# Patient Record
Sex: Male | Born: 1950 | Race: White | Hispanic: No | Smoking: Former smoker
Health system: Southern US, Community
[De-identification: ages and names within clinical notes are randomized; demographics above are authoritative.]

## PROBLEM LIST (undated history)

## (undated) DIAGNOSIS — R51 Headache: Secondary | ICD-10-CM

## (undated) DIAGNOSIS — Z8739 Personal history of other diseases of the musculoskeletal system and connective tissue: Secondary | ICD-10-CM

## (undated) DIAGNOSIS — M1990 Unspecified osteoarthritis, unspecified site: Secondary | ICD-10-CM

## (undated) DIAGNOSIS — M545 Low back pain, unspecified: Secondary | ICD-10-CM

## (undated) DIAGNOSIS — M2550 Pain in unspecified joint: Secondary | ICD-10-CM

## (undated) HISTORY — PX: HERNIA REPAIR: SHX51

## (undated) HISTORY — PX: OTHER SURGICAL HISTORY: SHX169

---

## 1978-09-21 HISTORY — PX: NASAL SEPTUM SURGERY: SHX37

## 1988-09-20 HISTORY — PX: UMBILICAL HERNIA REPAIR: SHX196

## 2011-07-16 HISTORY — PX: SHOULDER ARTHROSCOPY W/ ROTATOR CUFF REPAIR: SHX2400

## 2012-04-19 ENCOUNTER — Encounter (HOSPITAL_COMMUNITY): Payer: Self-pay

## 2012-04-21 NOTE — Pre-Procedure Instructions (Signed)
Travis Collier  04/21/2012   Your procedure is scheduled on:  Wednesday, April 9  Report to [facility] Short Stay Center at 0745 AM.  Call this number if you have problems the morning of surgery: [phone]   Remember:   Do not eat food or drink liquids after midnight.Tuesday night   Take these medicines the morning of surgery with A SIP OF WATER: Amlodipine   Do not wear jewelry, make-up or nail polish.  Do not wear lotions, powders, or perfumes or  deodorant.  Do not shave 48 hours prior to surgery. Men may shave face and neck.  Do not bring valuables to the hospital.  Contacts, dentures or bridgework may not be worn into surgery.  Leave suitcase in the car. After surgery it may be brought to your room.  For patients admitted to the hospital, checkout time is 11:00 AM the day of  discharge.      Special Instructions: Shower using CHG 2 nights before surgery and the night before surgery.  If you shower the day of surgery use CHG.  Use special wash - you have one bottle of CHG for all showers.  You should use approximately 1/3 of the bottle for each shower.   Please read over the following fact sheets that you were given: Pain Booklet, Coughing and Deep Breathing, Blood Transfusion Information, Total Joint Packet and Surgical Site Infection Prevention

## 2012-04-22 ENCOUNTER — Encounter (HOSPITAL_COMMUNITY)
Admission: RE | Admit: 2012-04-22 | Discharge: 2012-04-22 | Disposition: A | Discharge status: Home / Self Care | Payer: Worker's Compensation | Source: Ambulatory Visit | Attending: Orthopedic Surgery | Admitting: Orthopedic Surgery

## 2012-04-22 ENCOUNTER — Ambulatory Visit (HOSPITAL_COMMUNITY)
Admission: RE | Admit: 2012-04-22 | Discharge: 2012-04-22 | Disposition: A | Discharge status: Home / Self Care | Payer: Worker's Compensation | Source: Ambulatory Visit | Attending: Surgery | Admitting: Surgery

## 2012-04-22 ENCOUNTER — Encounter (HOSPITAL_COMMUNITY): Payer: Self-pay

## 2012-04-22 DIAGNOSIS — I10 Essential (primary) hypertension: Secondary | ICD-10-CM | POA: Insufficient documentation

## 2012-04-22 DIAGNOSIS — Z01818 Encounter for other preprocedural examination: Secondary | ICD-10-CM | POA: Insufficient documentation

## 2012-04-22 DIAGNOSIS — I7090 Unspecified atherosclerosis: Secondary | ICD-10-CM | POA: Insufficient documentation

## 2012-04-22 HISTORY — DX: Unspecified osteoarthritis, unspecified site: M19.90

## 2012-04-22 LAB — URINALYSIS, ROUTINE W REFLEX MICROSCOPIC
Hgb urine dipstick: NEGATIVE
Ketones, ur: NEGATIVE mg/dL
Protein, ur: NEGATIVE mg/dL
Urobilinogen, UA: 0.2 mg/dL (ref 0.0–1.0)

## 2012-04-22 LAB — CBC
MCH: 30.4 pg (ref 26.0–34.0)
MCHC: 36.1 g/dL — ABNORMAL HIGH (ref 30.0–36.0)
Platelets: 232 10*3/uL (ref 150–400)
RDW: 12.8 % (ref 11.5–15.5)

## 2012-04-22 LAB — COMPREHENSIVE METABOLIC PANEL
ALT: 34 U/L (ref 0–53)
AST: 31 U/L (ref 0–37)
Calcium: 10.1 mg/dL (ref 8.4–10.5)
Sodium: 136 mEq/L (ref 135–145)
Total Protein: 7.8 g/dL (ref 6.0–8.3)

## 2012-04-22 LAB — SURGICAL PCR SCREEN: MRSA, PCR: NEGATIVE

## 2012-04-22 NOTE — Progress Notes (Signed)
Stop -Bang score 4; no PCP

## 2012-04-27 MED ORDER — DEXTROSE 5 % IV SOLN
3.0000 g | INTRAVENOUS | Status: AC
  Administered 2012-04-28: 3 g via INTRAVENOUS
  Filled 2012-04-27: qty 3000

## 2012-04-27 NOTE — Progress Notes (Signed)
Patient returned call and Nurse informed patient to arrive at Short Stay 3rd floor at 0700 tomorrow morning. Patient verbalized understanding.

## 2012-04-27 NOTE — H&P (Signed)
  [facility] [address]   [address] [place], [place] [id number] [phone] A Division of [facility]  Loreta Ave, M.D.   Robert A. Thurston Hole, M.D.   Burnell Blanks, M.D.   Eulas Post, M.D.   Lunette Stands, M.D Buford Dresser, M.D.  Charlsie Quest, M.D.   Estell Harpin, M.D.   Melina Fiddler, M.D. Genene Churn. Barry Dienes, PA-C            Kirstin A. Shepperson, PA-C Josh Bingham, PA-C [place], [place]   RE: Travis Collier, Travis Collier   [id number]      DOB: 1950/06/27 PROGRESS NOTE: 04-20-12 Chief complaint: right knee pain. History of present illness: 62 year old white male with right knee degenerative joint disease and chronic pain. States symptoms are unchanged and he wants to proceed with total knee replacement as scheduled. Current medications: Amlodipine, HCTZ, multivitamin Glucosamine chondroitin zinc supplement. No known drug allergies.  Past medical/surgical history: hypertension rotator cuff surgery hernia repair nasal surgery. Family history positive for heart disease hypertension cancer. Social history: he is married and employed in trucking. Denies smoking or alcohol use.  Review of systems: no lightheadedness fever chills cardiac pulmonary GI GU or neuro issues.  EXAMINATION: Height 6'2" 270 pounds. Temp 94.3 pulse 92 blood pressure 160/94. Head is normal cephalic atraumatic. PERRLA and EOMI. Neck unremarkable. Lungs CTA bilaterally. No wheezes noted. Heart regular rate and rhythm no murmurs. Abdomen round non-distended. NABS x4. Soft non-tender. Right knee decreased range of motion positive crepitus positive effusion joint line tenderness ligaments stable. Calf non-tender neurovascularly intact. Skin warm and dry.   X-RAYS: Previous films show end stage degenerative joint disease with periarticular spurs.  IMPRESSION: Right knee end stage degenerative joint disease. Failed conservative  treatment.  DISPOSITION: We'll proceed with right total knee replacement as scheduled. Discussed risks benefits and possible complications in detail. All questions answered.  Loreta Ave, M.D.  Electronically verified by Loreta Ave, M.D. DFM(JMO):kh D 04-20-12 T 04-21-12

## 2012-04-27 NOTE — Progress Notes (Signed)
Left message on voicemail regarding change in arrival time to 0700.

## 2012-04-28 ENCOUNTER — Inpatient Hospital Stay (HOSPITAL_COMMUNITY): Payer: Worker's Compensation

## 2012-04-28 ENCOUNTER — Encounter (HOSPITAL_COMMUNITY)
Admission: RE | Disposition: A | Discharge status: Home / Self Care | Payer: Self-pay | Source: Ambulatory Visit | Attending: Orthopedic Surgery

## 2012-04-28 ENCOUNTER — Encounter (HOSPITAL_COMMUNITY): Payer: Self-pay | Admitting: *Deleted

## 2012-04-28 ENCOUNTER — Ambulatory Visit (HOSPITAL_COMMUNITY): Payer: Worker's Compensation | Admitting: *Deleted

## 2012-04-28 ENCOUNTER — Inpatient Hospital Stay (HOSPITAL_COMMUNITY)
Admission: RE | Admit: 2012-04-28 | Discharge: 2012-04-30 | DRG: 470 | Disposition: A | Discharge status: Home / Self Care | Payer: Worker's Compensation | Source: Ambulatory Visit | Attending: Orthopedic Surgery | Admitting: Orthopedic Surgery

## 2012-04-28 DIAGNOSIS — I10 Essential (primary) hypertension: Secondary | ICD-10-CM | POA: Diagnosis present

## 2012-04-28 DIAGNOSIS — Z01810 Encounter for preprocedural cardiovascular examination: Secondary | ICD-10-CM

## 2012-04-28 DIAGNOSIS — Z79899 Other long term (current) drug therapy: Secondary | ICD-10-CM

## 2012-04-28 DIAGNOSIS — Z96651 Presence of right artificial knee joint: Secondary | ICD-10-CM

## 2012-04-28 DIAGNOSIS — G8929 Other chronic pain: Secondary | ICD-10-CM | POA: Diagnosis present

## 2012-04-28 DIAGNOSIS — Z01812 Encounter for preprocedural laboratory examination: Secondary | ICD-10-CM

## 2012-04-28 DIAGNOSIS — M1710 Unilateral primary osteoarthritis, unspecified knee: Principal | ICD-10-CM | POA: Diagnosis present

## 2012-04-28 HISTORY — PX: TOTAL KNEE ARTHROPLASTY: SHX125

## 2012-04-28 LAB — CBC
HCT: 39.8 % (ref 39.0–52.0)
Hemoglobin: 13.9 g/dL (ref 13.0–17.0)
MCH: 30 pg (ref 26.0–34.0)
MCHC: 34.9 g/dL (ref 30.0–36.0)
RDW: 13 % (ref 11.5–15.5)

## 2012-04-28 LAB — TYPE AND SCREEN
ABO/RH(D): O NEG
ABO/RH(D): O NEG
Antibody Screen: NEGATIVE

## 2012-04-28 LAB — CREATININE, SERUM
Creatinine, Ser: 1.02 mg/dL (ref 0.50–1.35)
GFR calc non Af Amer: 79 mL/min — ABNORMAL LOW (ref >90)

## 2012-04-28 SURGERY — ARTHROPLASTY, KNEE, TOTAL
Anesthesia: General | Site: Knee | Laterality: Right | Wound class: Clean

## 2012-04-28 MED ORDER — DEXTROSE 5 % IV SOLN: 500.0000 mg | Freq: Four times a day (QID) | INTRAVENOUS | Status: DC | PRN

## 2012-04-28 MED ORDER — LACTATED RINGERS IV SOLN
INTRAVENOUS | Status: DC
  Administered 2012-04-28: 09:00:00 via INTRAVENOUS

## 2012-04-28 MED ORDER — POTASSIUM CHLORIDE IN NACL 20-0.9 MEQ/L-% IV SOLN
INTRAVENOUS | Status: DC
  Filled 2012-04-28 (×6): qty 1000

## 2012-04-28 MED ORDER — BUPIVACAINE LIPOSOME 1.3 % IJ SUSP
20.0000 mL | Freq: Once | INTRAMUSCULAR | Status: DC
  Filled 2012-04-28: qty 20

## 2012-04-28 MED ORDER — METHOCARBAMOL 500 MG PO TABS
500.0000 mg | ORAL_TABLET | Freq: Four times a day (QID) | ORAL | Status: DC | PRN
  Administered 2012-04-28 – 2012-04-30 (×6): 500 mg via ORAL
  Filled 2012-04-28 (×6): qty 1

## 2012-04-28 MED ORDER — NEOSTIGMINE METHYLSULFATE 1 MG/ML IJ SOLN
INTRAMUSCULAR | Status: DC | PRN
  Administered 2012-04-28: 5 mg via INTRAVENOUS

## 2012-04-28 MED ORDER — ENOXAPARIN SODIUM 30 MG/0.3ML ~~LOC~~ SOLN
30.0000 mg | Freq: Two times a day (BID) | SUBCUTANEOUS | Status: DC
  Administered 2012-04-29 – 2012-04-30 (×3): 30 mg via SUBCUTANEOUS
  Filled 2012-04-28 (×5): qty 0.3

## 2012-04-28 MED ORDER — CEFAZOLIN SODIUM 1-5 GM-% IV SOLN
1.0000 g | Freq: Three times a day (TID) | INTRAVENOUS | Status: AC
  Administered 2012-04-28 (×2): 1 g via INTRAVENOUS
  Filled 2012-04-28 (×2): qty 50

## 2012-04-28 MED ORDER — WARFARIN - PHARMACIST DOSING INPATIENT: Freq: Every day | Status: DC

## 2012-04-28 MED ORDER — GLYCOPYRROLATE 0.2 MG/ML IJ SOLN
INTRAMUSCULAR | Status: DC | PRN
  Administered 2012-04-28: .6 mg via INTRAVENOUS

## 2012-04-28 MED ORDER — SENNOSIDES-DOCUSATE SODIUM 8.6-50 MG PO TABS: 1.0000 | ORAL_TABLET | Freq: Every evening | ORAL | Status: DC | PRN

## 2012-04-28 MED ORDER — PROMETHAZINE HCL 25 MG/ML IJ SOLN
6.2500 mg | INTRAMUSCULAR | Status: DC | PRN
  Administered 2012-04-28: 6.25 mg via INTRAVENOUS

## 2012-04-28 MED ORDER — LACTATED RINGERS IV SOLN
INTRAVENOUS | Status: DC | PRN
  Administered 2012-04-28 (×2): via INTRAVENOUS

## 2012-04-28 MED ORDER — SODIUM CHLORIDE 0.9 % IR SOLN
Status: DC | PRN
  Administered 2012-04-28: 3000 mL
  Administered 2012-04-28: 1000 mL

## 2012-04-28 MED ORDER — WARFARIN SODIUM 7.5 MG PO TABS
7.5000 mg | ORAL_TABLET | Freq: Once | ORAL | Status: AC
  Administered 2012-04-28: 7.5 mg via ORAL
  Filled 2012-04-28 (×2): qty 1

## 2012-04-28 MED ORDER — COUMADIN BOOK
1.0000 | Freq: Once | Status: AC
  Administered 2012-04-28: 1
  Filled 2012-04-28: qty 1

## 2012-04-28 MED ORDER — DOCUSATE SODIUM 100 MG PO CAPS
100.0000 mg | ORAL_CAPSULE | Freq: Two times a day (BID) | ORAL | Status: DC
  Administered 2012-04-28 – 2012-04-30 (×4): 100 mg via ORAL
  Filled 2012-04-28 (×5): qty 1

## 2012-04-28 MED ORDER — LIDOCAINE HCL (CARDIAC) 20 MG/ML IV SOLN
INTRAVENOUS | Status: DC | PRN
  Administered 2012-04-28: 100 mg via INTRAVENOUS

## 2012-04-28 MED ORDER — ONDANSETRON HCL 4 MG/2ML IJ SOLN: 4.0000 mg | Freq: Four times a day (QID) | INTRAMUSCULAR | Status: DC | PRN

## 2012-04-28 MED ORDER — AMLODIPINE BESYLATE 10 MG PO TABS
10.0000 mg | ORAL_TABLET | Freq: Every day | ORAL | Status: DC
  Administered 2012-04-28 – 2012-04-30 (×3): 10 mg via ORAL
  Filled 2012-04-28 (×3): qty 1

## 2012-04-28 MED ORDER — METOCLOPRAMIDE HCL 5 MG/ML IJ SOLN: 5.0000 mg | Freq: Three times a day (TID) | INTRAMUSCULAR | Status: DC | PRN

## 2012-04-28 MED ORDER — OXYCODONE HCL 5 MG PO TABS
ORAL_TABLET | ORAL | Status: AC
  Filled 2012-04-28: qty 1

## 2012-04-28 MED ORDER — OXYCODONE HCL 5 MG PO TABS
5.0000 mg | ORAL_TABLET | Freq: Once | ORAL | Status: AC | PRN
  Administered 2012-04-28: 5 mg via ORAL

## 2012-04-28 MED ORDER — MEPERIDINE HCL 25 MG/ML IJ SOLN: 6.2500 mg | INTRAMUSCULAR | Status: DC | PRN

## 2012-04-28 MED ORDER — ONDANSETRON HCL 4 MG PO TABS: 4.0000 mg | ORAL_TABLET | Freq: Four times a day (QID) | ORAL | Status: DC | PRN

## 2012-04-28 MED ORDER — WARFARIN VIDEO
1.0000 | Freq: Once | Status: AC
  Administered 2012-04-29: 1

## 2012-04-28 MED ORDER — STERILE WATER FOR IRRIGATION IR SOLN
Status: DC | PRN
  Administered 2012-04-28: 3000 mL

## 2012-04-28 MED ORDER — METOCLOPRAMIDE HCL 10 MG PO TABS: 5.0000 mg | ORAL_TABLET | Freq: Three times a day (TID) | ORAL | Status: DC | PRN

## 2012-04-28 MED ORDER — BISACODYL 10 MG RE SUPP: 10.0000 mg | Freq: Every day | RECTAL | Status: DC | PRN

## 2012-04-28 MED ORDER — BUPIVACAINE HCL (PF) 0.25 % IJ SOLN
INTRAMUSCULAR | Status: DC | PRN
  Administered 2012-04-28: 10 mL

## 2012-04-28 MED ORDER — ROCURONIUM BROMIDE 100 MG/10ML IV SOLN
INTRAVENOUS | Status: DC | PRN
  Administered 2012-04-28: 50 mg via INTRAVENOUS

## 2012-04-28 MED ORDER — OXYCODONE HCL 5 MG/5ML PO SOLN: 5.0000 mg | Freq: Once | ORAL | Status: AC | PRN

## 2012-04-28 MED ORDER — HYDROCODONE-ACETAMINOPHEN 10-325 MG PO TABS
1.0000 | ORAL_TABLET | ORAL | Status: DC | PRN
  Administered 2012-04-28: 2 via ORAL
  Administered 2012-04-29: 1 via ORAL
  Administered 2012-04-29 (×2): 2 via ORAL
  Administered 2012-04-29: 1 via ORAL
  Administered 2012-04-29 – 2012-04-30 (×4): 2 via ORAL
  Filled 2012-04-28 (×9): qty 2

## 2012-04-28 MED ORDER — HYDROMORPHONE HCL PF 1 MG/ML IJ SOLN
1.0000 mg | INTRAMUSCULAR | Status: DC | PRN
  Administered 2012-04-28: 1 mg via INTRAVENOUS
  Filled 2012-04-28: qty 1

## 2012-04-28 MED ORDER — ACETAMINOPHEN 325 MG PO TABS: 650.0000 mg | ORAL_TABLET | Freq: Four times a day (QID) | ORAL | Status: DC | PRN

## 2012-04-28 MED ORDER — BUPIVACAINE LIPOSOME 1.3 % IJ SUSP
INTRAMUSCULAR | Status: DC | PRN
  Administered 2012-04-28: 11:00:00

## 2012-04-28 MED ORDER — MIDAZOLAM HCL 5 MG/5ML IJ SOLN
INTRAMUSCULAR | Status: DC | PRN
  Administered 2012-04-28: 2 mg via INTRAVENOUS

## 2012-04-28 MED ORDER — PROMETHAZINE HCL 25 MG/ML IJ SOLN
INTRAMUSCULAR | Status: AC
  Filled 2012-04-28: qty 1

## 2012-04-28 MED ORDER — BUPIVACAINE HCL (PF) 0.25 % IJ SOLN
INTRAMUSCULAR | Status: AC
  Filled 2012-04-28: qty 30

## 2012-04-28 MED ORDER — PROPOFOL 10 MG/ML IV BOLUS
INTRAVENOUS | Status: DC | PRN
  Administered 2012-04-28: 200 mg via INTRAVENOUS

## 2012-04-28 MED ORDER — HYDROMORPHONE HCL PF 1 MG/ML IJ SOLN
0.2500 mg | INTRAMUSCULAR | Status: DC | PRN
  Administered 2012-04-28: 0.25 mg via INTRAVENOUS

## 2012-04-28 MED ORDER — PHENOL 1.4 % MT LIQD: 1.0000 | OROMUCOSAL | Status: DC | PRN

## 2012-04-28 MED ORDER — ACETAMINOPHEN 10 MG/ML IV SOLN
INTRAVENOUS | Status: AC
  Filled 2012-04-28: qty 100

## 2012-04-28 MED ORDER — MENTHOL 3 MG MT LOZG: 1.0000 | LOZENGE | OROMUCOSAL | Status: DC | PRN

## 2012-04-28 MED ORDER — FENTANYL CITRATE 0.05 MG/ML IJ SOLN
INTRAMUSCULAR | Status: DC | PRN
  Administered 2012-04-28: 50 ug via INTRAVENOUS
  Administered 2012-04-28 (×2): 100 ug via INTRAVENOUS
  Administered 2012-04-28: 50 ug via INTRAVENOUS
  Administered 2012-04-28 (×3): 100 ug via INTRAVENOUS
  Administered 2012-04-28: 50 ug via INTRAVENOUS
  Administered 2012-04-28: 100 ug via INTRAVENOUS

## 2012-04-28 MED ORDER — HYDROCHLOROTHIAZIDE 25 MG PO TABS
25.0000 mg | ORAL_TABLET | Freq: Every day | ORAL | Status: DC
  Administered 2012-04-28 – 2012-04-30 (×3): 25 mg via ORAL
  Filled 2012-04-28 (×3): qty 1

## 2012-04-28 MED ORDER — HYDROMORPHONE HCL PF 1 MG/ML IJ SOLN
INTRAMUSCULAR | Status: AC
  Filled 2012-04-28: qty 1

## 2012-04-28 MED ORDER — ONDANSETRON HCL 4 MG/2ML IJ SOLN
INTRAMUSCULAR | Status: DC | PRN
  Administered 2012-04-28: 4 mg via INTRAVENOUS

## 2012-04-28 MED ORDER — METHOCARBAMOL 500 MG PO TABS
ORAL_TABLET | ORAL | Status: AC
  Filled 2012-04-28: qty 1

## 2012-04-28 MED ORDER — ACETAMINOPHEN 650 MG RE SUPP: 650.0000 mg | Freq: Four times a day (QID) | RECTAL | Status: DC | PRN

## 2012-04-28 SURGICAL SUPPLY — 57 items
BANDAGE ESMARK 6X9 LF (GAUZE/BANDAGES/DRESSINGS) ×1 IMPLANT
BLADE SAG 18X100X1.27 (BLADE) ×4 IMPLANT
BNDG ESMARK 6X9 LF (GAUZE/BANDAGES/DRESSINGS) ×2
BOOTCOVER CLEANROOM LRG (PROTECTIVE WEAR) ×4 IMPLANT
BOWL SMART MIX CTS (DISPOSABLE) ×2 IMPLANT
CEMENT BONE SIMPLEX SPEEDSET (Cement) ×4 IMPLANT
CLOTH BEACON ORANGE TIMEOUT ST (SAFETY) ×2 IMPLANT
COVER SURGICAL LIGHT HANDLE (MISCELLANEOUS) ×2 IMPLANT
CUFF TOURNIQUET SINGLE 34IN LL (TOURNIQUET CUFF) ×2 IMPLANT
DRAPE EXTREMITY T 121X128X90 (DRAPE) ×2 IMPLANT
DRAPE PROXIMA HALF (DRAPES) ×2 IMPLANT
DRAPE U-SHAPE 47X51 STRL (DRAPES) ×2 IMPLANT
DRSG PAD ABDOMINAL 8X10 ST (GAUZE/BANDAGES/DRESSINGS) ×2 IMPLANT
DURAPREP 26ML APPLICATOR (WOUND CARE) ×2 IMPLANT
ELECT CAUTERY BLADE 6.4 (BLADE) ×2 IMPLANT
ELECT REM PT RETURN 9FT ADLT (ELECTROSURGICAL) ×2
ELECTRODE REM PT RTRN 9FT ADLT (ELECTROSURGICAL) ×1 IMPLANT
EVACUATOR 1/8 PVC DRAIN (DRAIN) ×2 IMPLANT
FACESHIELD LNG OPTICON STERILE (SAFETY) ×2 IMPLANT
GAUZE XEROFORM 5X9 LF (GAUZE/BANDAGES/DRESSINGS) ×2 IMPLANT
GLOVE BIOGEL PI IND STRL 6.5 (GLOVE) ×2 IMPLANT
GLOVE BIOGEL PI IND STRL 8 (GLOVE) ×3 IMPLANT
GLOVE BIOGEL PI INDICATOR 6.5 (GLOVE) ×2
GLOVE BIOGEL PI INDICATOR 8 (GLOVE) ×3
GLOVE ORTHO TXT STRL SZ7.5 (GLOVE) ×4 IMPLANT
GOWN PREVENTION PLUS XLARGE (GOWN DISPOSABLE) ×4 IMPLANT
GOWN STRL NON-REIN LRG LVL3 (GOWN DISPOSABLE) ×6 IMPLANT
GOWN STRL REIN 2XL XLG LVL4 (GOWN DISPOSABLE) ×2 IMPLANT
HANDPIECE INTERPULSE COAX TIP (DISPOSABLE) ×1
IMMOBILIZER KNEE 22 UNIV (SOFTGOODS) ×2 IMPLANT
IMMOBILIZER KNEE 24 THIGH 36 (MISCELLANEOUS) IMPLANT
IMMOBILIZER KNEE 24 UNIV (MISCELLANEOUS)
KIT BASIN OR (CUSTOM PROCEDURE TRAY) ×2 IMPLANT
KIT ROOM TURNOVER OR (KITS) ×2 IMPLANT
MANIFOLD NEPTUNE II (INSTRUMENTS) ×2 IMPLANT
NEEDLE 18GX1X1/2 (RX/OR ONLY) (NEEDLE) ×8 IMPLANT
NEEDLE HYPO 25GX1X1/2 BEV (NEEDLE) ×2 IMPLANT
NS IRRIG 1000ML POUR BTL (IV SOLUTION) ×2 IMPLANT
PACK TOTAL JOINT (CUSTOM PROCEDURE TRAY) ×2 IMPLANT
PAD ARMBOARD 7.5X6 YLW CONV (MISCELLANEOUS) ×4 IMPLANT
PAD CAST 4YDX4 CTTN HI CHSV (CAST SUPPLIES) ×1 IMPLANT
PADDING CAST COTTON 4X4 STRL (CAST SUPPLIES) ×1
PADDING CAST COTTON 6X4 STRL (CAST SUPPLIES) ×2 IMPLANT
RUBBERBAND STERILE (MISCELLANEOUS) ×2 IMPLANT
SET HNDPC FAN SPRY TIP SCT (DISPOSABLE) ×1 IMPLANT
SPONGE GAUZE 4X4 12PLY (GAUZE/BANDAGES/DRESSINGS) ×2 IMPLANT
STAPLER VISISTAT 35W (STAPLE) ×2 IMPLANT
SUCTION FRAZIER TIP 10 FR DISP (SUCTIONS) ×2 IMPLANT
SUT VIC AB 1 CTX 36 (SUTURE) ×2
SUT VIC AB 1 CTX36XBRD ANBCTR (SUTURE) ×2 IMPLANT
SUT VIC AB 2-0 CT1 27 (SUTURE) ×2
SUT VIC AB 2-0 CT1 TAPERPNT 27 (SUTURE) ×2 IMPLANT
SYR CONTROL 10ML LL (SYRINGE) ×2 IMPLANT
TOWEL OR 17X24 6PK STRL BLUE (TOWEL DISPOSABLE) ×2 IMPLANT
TOWEL OR 17X26 10 PK STRL BLUE (TOWEL DISPOSABLE) ×2 IMPLANT
TRAY FOLEY CATH 16FR SILVER (SET/KITS/TRAYS/PACK) ×2 IMPLANT
WATER STERILE IRR 1000ML POUR (IV SOLUTION) ×4 IMPLANT

## 2012-04-28 NOTE — Brief Op Note (Signed)
04/28/2012  2:20 PM  PATIENT:  Jamareon Philbrook  62 y.o. male  PRE-OPERATIVE DIAGNOSIS:  DJD RIGHT KNEE  POST-OPERATIVE DIAGNOSIS:  DJD RIGHT KNEE  PROCEDURE:  Procedure(s): TOTAL KNEE ARTHROPLASTY (Right)  SURGEON:  Surgeon(s) and Role:    * Loreta Ave, MD - Primary  PHYSICIAN ASSISTANT: Aadvika Konen M   ANESTHESIA:   general  EBL:  Total I/O In: 1700 [I.V.:1700] Out: 400 [Urine:300; Blood:100]  BLOOD ADMINISTERED:none  DRAINS  hemovac  LOCAL MEDICATIONS USED:  Exparel/injectable ns 20:40  SPECIMEN:  No Specimen  DISPOSITION OF SPECIMEN:  N/A  COUNTS:  YES  TOURNIQUET:   Total Tourniquet Time Documented: Thigh (Right) - 91 minutes Total: Thigh (Right) - 91 minutes    PATIENT DISPOSITION:  PACU - hemodynamically stable.

## 2012-04-28 NOTE — Anesthesia Postprocedure Evaluation (Signed)
  Anesthesia Post-op Note  Patient: Travis Collier  Procedure(s) Performed: Procedure(s): TOTAL KNEE ARTHROPLASTY (Right)  Patient Location: PACU  Anesthesia Type:General  Level of Consciousness: awake and alert   Airway and Oxygen Therapy: Patient Spontanous Breathing  Post-op Pain: mild  Post-op Assessment: Post-op Vital signs reviewed  Post-op Vital Signs: stable  Complications: No apparent anesthesia complications

## 2012-04-28 NOTE — Consult Note (Signed)
ANTICOAGULATION CONSULT NOTE - Initial Consult  Pharmacy Consult for Coumadin Indication: VTE prophylaxis following R-TKA  Allergies: No Known Allergies  Vital Signs: BP 124/70  Pulse 79  Temp(Src) 98.1 F (36.7 C) (Oral)  Resp 18  SpO2 98%  Labs:  Lab Results  Component Value Date   INR 1.02 04/22/2012   Medical / Surgical History: Past Medical History  Diagnosis Date  . Hypertension   . Arthritis    Past Surgical History  Procedure Laterality Date  . Rotator cuff repair    . Hernia repair    . Umbilical hernia repair    . Nasal septum surgery      Medications:  Prescriptions prior to admission  Medication Sig Dispense Refill  . amLODipine (NORVASC) 10 MG tablet Take 10 mg by mouth daily.      Marland Kitchen GLUCOSAMINE-CHONDROITIN PO Take 1 tablet by mouth daily.       . hydrochlorothiazide (HYDRODIURIL) 25 MG tablet Take 25 mg by mouth daily.      Marland Kitchen ibuprofen (ADVIL,MOTRIN) 200 MG tablet Take 200 mg by mouth every 6 (six) hours as needed for pain.      . Multiple Vitamin (MULTIVITAMIN WITH MINERALS) TABS Take 1 tablet by mouth daily.      . Multiple Vitamins-Minerals (ZINC PO) Take 1 tablet by mouth daily.       Scheduled:  . acetaminophen      . amLODipine  10 mg Oral Daily  . [COMPLETED]  ceFAZolin (ANCEF) IV  3 g Intravenous On Call to OR  .  ceFAZolin (ANCEF) IV  1 g Intravenous Q8H  . docusate sodium  100 mg Oral BID  . [START ON 04/29/2012] enoxaparin (LOVENOX) injection  30 mg Subcutaneous Q12H  . hydrochlorothiazide  25 mg Oral Daily  . HYDROmorphone      . methocarbamol      . oxyCODONE      . promethazine      . [DISCONTINUED] bupivacaine liposome  20 mL Infiltration Once    Assessment:  61 y.o.male s/p R-TKA placed on Coumadin post-op for VTE prophylaxis  Baseline INR 1.02.  Coumadin predictor score = 6  Goal of Therapy:   INR 2-3      Plan:   Coumadin 7.5 mg x 1 Daily INR's, CBC.  Lovenox bridging 30 mg sq q 12 hours Coumadin discharge  education started.   Pancho Rushing, Elisha Headland,  Pharm.D.. 04/28/2012,  3:55 PM

## 2012-04-28 NOTE — Preoperative (Signed)
Beta Blockers   Reason not to administer Beta Blockers:Not Applicable 

## 2012-04-28 NOTE — Interval H&P Note (Signed)
History and Physical Interval Note:  04/28/2012 8:09 AM  Travis Collier  has presented today for surgery, with the diagnosis of DJD RIGHT KNEE  The various methods of treatment have been discussed with the patient and family. After consideration of risks, benefits and other options for treatment, the patient has consented to  Procedure(s): TOTAL KNEE ARTHROPLASTY (Right) as a surgical intervention .  The patient's history has been reviewed, patient examined, no change in status, stable for surgery.  I have reviewed the patient's chart and labs.  Questions were answered to the patient's satisfaction.     Kari Montero F

## 2012-04-28 NOTE — Progress Notes (Signed)
Orthopedic Tech Progress Note Patient Details:  Travis Collier May 05, 1950 [id number]  CPM Right Knee CPM Right Knee: On Right Knee Flexion (Degrees): 60 Right Knee Extension (Degrees): 0 Additional Comments: trapeze bar   Cammer, Mickie Bail 04/28/2012, 2:20 PM

## 2012-04-28 NOTE — Anesthesia Procedure Notes (Signed)
Procedure Name: Intubation Date/Time: 04/28/2012 10:14 AM Performed by: Brien Mates DOBSON Pre-anesthesia Checklist: Patient identified, Emergency Drugs available, Suction available, Patient being monitored and Timeout performed Patient Re-evaluated:Patient Re-evaluated prior to inductionOxygen Delivery Method: Circle system utilized Preoxygenation: Pre-oxygenation with 100% oxygen Intubation Type: IV induction Ventilation: Mask ventilation without difficulty and Oral airway inserted - appropriate to patient size Laryngoscope Size: Miller and 2 Grade View: Grade I Tube type: Oral Tube size: 7.5 mm Number of attempts: 1 Airway Equipment and Method: Stylet Placement Confirmation: ETT inserted through vocal cords under direct vision,  positive ETCO2 and breath sounds checked- equal and bilateral Secured at: 23 cm Tube secured with: Tape Dental Injury: Teeth and Oropharynx as per pre-operative assessment

## 2012-04-28 NOTE — Anesthesia Preprocedure Evaluation (Addendum)
Anesthesia Evaluation  Patient identified by MRN, date of birth, ID band Patient awake    Reviewed: Allergy & Precautions, H&P , NPO status , Patient's Chart, lab work & pertinent test results, reviewed documented beta blocker date and time   History of Anesthesia Complications Negative for: history of anesthetic complications  Airway Mallampati: I  Neck ROM: Full    Dental  (+) Teeth Intact and Dental Advisory Given   Pulmonary neg pulmonary ROS,  breath sounds clear to auscultation        Cardiovascular hypertension, Pt. on medications Rhythm:Regular Rate:Normal     Neuro/Psych negative neurological ROS     GI/Hepatic negative GI ROS, Neg liver ROS,   Endo/Other    Renal/GU negative Renal ROS     Musculoskeletal  (+) Arthritis -, Osteoarthritis,    Abdominal (+) + obese,   Peds  Hematology negative hematology ROS (+)   Anesthesia Other Findings   Reproductive/Obstetrics                         Anesthesia Physical Anesthesia Plan  ASA: II  Anesthesia Plan: General   Post-op Pain Management:    Induction: Intravenous  Airway Management Planned: Oral ETT  Additional Equipment:   Intra-op Plan:   Post-operative Plan: Extubation in OR  Informed Consent:   Dental advisory given  Plan Discussed with: CRNA, Surgeon and Anesthesiologist  Anesthesia Plan Comments:        Anesthesia Quick Evaluation

## 2012-04-28 NOTE — Transfer of Care (Signed)
Immediate Anesthesia Transfer of Care Note  Patient: Travis Collier  Procedure(s) Performed: Procedure(s): TOTAL KNEE ARTHROPLASTY (Right)  Patient Location: PACU  Anesthesia Type:General  Level of Consciousness: sedated  Airway & Oxygen Therapy: Patient Spontanous Breathing and Patient connected to face mask oxygen  Post-op Assessment: Report given to PACU RN and Post -op Vital signs reviewed and stable  Post vital signs: Reviewed and stable  Complications: No apparent anesthesia complications

## 2012-04-29 ENCOUNTER — Encounter (HOSPITAL_COMMUNITY): Payer: Self-pay | Admitting: General Practice

## 2012-04-29 LAB — PROTIME-INR: INR: 1.05 (ref 0.00–1.49)

## 2012-04-29 LAB — BASIC METABOLIC PANEL
BUN: 15 mg/dL (ref 6–23)
Chloride: 97 mEq/L (ref 96–112)
Creatinine, Ser: 1.08 mg/dL (ref 0.50–1.35)
Glucose, Bld: 141 mg/dL — ABNORMAL HIGH (ref 70–99)
Potassium: 3.8 mEq/L (ref 3.5–5.1)

## 2012-04-29 LAB — CBC
HCT: 35.9 % — ABNORMAL LOW (ref 39.0–52.0)
Hemoglobin: 12.5 g/dL — ABNORMAL LOW (ref 13.0–17.0)
MCV: 85.1 fL (ref 78.0–100.0)
WBC: 12.3 10*3/uL — ABNORMAL HIGH (ref 4.0–10.5)

## 2012-04-29 MED ORDER — WARFARIN SODIUM 7.5 MG PO TABS
7.5000 mg | ORAL_TABLET | Freq: Once | ORAL | Status: AC
  Administered 2012-04-29: 7.5 mg via ORAL
  Filled 2012-04-29: qty 1

## 2012-04-29 NOTE — Progress Notes (Signed)
ANTICOAGULATION CONSULT NOTE - Follow Up Consult  Pharmacy Consult for Coumadin Indication: VTE prophylaxis  No Known Allergies  Vital Signs: Temp: 99 F (37.2 C) (04/10 0626) Temp src: Oral (04/10 0626) BP: 147/79 mmHg (04/10 0626) Pulse Rate: 91 (04/10 0626)  Labs:  Recent Labs  04/28/12 1609 04/29/12 0550  HGB 13.9 12.5*  HCT 39.8 35.9*  PLT 234 222  LABPROT  --  13.6  INR  --  1.05  CREATININE 1.02 1.08    CrCl is unknown because there is no height on file for the current visit.   Assessment: 38 YOM s/p R TKA, on coumadin for VTE prophylaxis, INR 1.08 after one dose of coumaidn 7.5mg  x 1 yesterday. Also on lovenox 30mg  sq q 12hrs. CBC stable, no bleeding noted. Anticipate d/c home tomorrow.    Goal of Therapy:  INR 2-3 Monitor platelets by anticoagulation protocol: Yes   Plan:  - Coumadin 7.5mg  po x 1 - f/u INR - Coumadin education with phrmacist  Bayard Hugger, PharmD, BCPS  Clinical Pharmacist  Pager: [phone]   04/29/2012,10:35 AM

## 2012-04-29 NOTE — Progress Notes (Signed)
UR COMPLETED  

## 2012-04-29 NOTE — Progress Notes (Signed)
Subjective: Doing well. Pain controlled.     Objective: Vital signs in last 24 hours: Temp:  [97.9 F (36.6 C)-99 F (37.2 C)] 99 F (37.2 C) (04/10 0626) Pulse Rate:  [72-92] 91 (04/10 0626) Resp:  [10-21] 16 (04/10 0626) BP: (113-151)/(69-85) 147/79 mmHg (04/10 0626) SpO2:  [95 %-99 %] 97 % (04/10 0626)  Intake/Output from previous day: 04/09 0701 - 04/10 0700 In: 2420 [P.O.:720; I.V.:1700] Out: 2235 [Urine:1715; Drains:420; Blood:100] Intake/Output this shift:     Recent Labs  04/28/12 1609 04/29/12 0550  HGB 13.9 12.5*    Recent Labs  04/28/12 1609 04/29/12 0550  WBC 22.5* 12.3*  RBC 4.64 4.22  HCT 39.8 35.9*  PLT 234 222    Recent Labs  04/28/12 1609 04/29/12 0550  NA  --  134*  K  --  3.8  CL  --  97  CO2  --  30  BUN  --  15  CREATININE 1.02 1.08  GLUCOSE  --  141*  CALCIUM  --  9.1    Recent Labs  04/29/12 0550  INR 1.05    Exam:  Dressing c/d/i.  Calf nt, nvi    Assessment/Plan: D/c dilaudid and foley.  Anticipate d/c home tomorrow.     Taj Arteaga M 04/29/2012, 10:22 AM

## 2012-04-29 NOTE — Progress Notes (Signed)
Physical Therapy Treatment Patient Details Name: Travis Collier MRN: [id number] DOB: 03/12/1950 Today's Date: 04/29/2012 Time: 8657-8469 PT Time Calculation (min): 32 min  PT Assessment / Plan / Recommendation Comments on Treatment Session  Pt is moving well this afternoon. Pain slightly limiting his ability to increase indpendence with gt and transfers. C/o pain 7/10 with amb. Antcipate ready to D/C home with wife and HHPT tomorrow. Plan to address stair amb goal in the morning to ensure safe D/C home.     Follow Up Recommendations  Home health PT;Supervision/Assistance - 24 hour     Does the patient have the potential to tolerate intense rehabilitation     Barriers to Discharge        Equipment Recommendations  None recommended by PT    Recommendations for Other Services    Frequency 7X/week   Plan Discharge plan remains appropriate;Frequency remains appropriate    Precautions / Restrictions Precautions Precautions: Knee;Fall Precaution Booklet Issued: Yes (comment) Required Braces or Orthoses: Knee Immobilizer - Right Knee Immobilizer - Right: On except when in CPM Restrictions Weight Bearing Restrictions: Yes RLE Weight Bearing: Weight bearing as tolerated   Pertinent Vitals/Pain 7/10 at end of session and during ambulation; pt given ice packs and repositioned in bed. RN present and notified.     Mobility  Bed Mobility Bed Mobility: Sit to Supine Sit to Supine: 4: Min guard;With rail Details for Bed Mobility Assistance: cues to use L LE to hook R LE and self assist onto bed; pt demo ability to complete task Transfers Transfers: Sit to Stand;Stand to Sit Sit to Stand: 4: Min guard;From chair/3-in-1;With armrests Stand to Sit: 5: Supervision;To bed Details for Transfer Assistance: required cues for hand placemend; continues to have a tendency to flop into seated position; less impulsive this afternoon and able to recall hand placement and sequencing commands more  consistently Ambulation/Gait Ambulation/Gait Assistance: 4: Min guard Ambulation Distance (Feet): 100 Feet Assistive device: Rolling walker Ambulation/Gait Assistance Details: pt continues to amb with step to gt; encouraged to progress to step through gt and equalize weight shift; required vc's for upright posture and RW safety with turns Gait Pattern: Step-to pattern;Decreased stance time - right;Decreased step length - left;Trunk flexed;Antalgic Gait velocity: decreased Stairs: No Wheelchair Mobility Wheelchair Mobility: No    Exercises Total Joint Exercises Ankle Circles/Pumps: AROM;20 reps;Both;Supine Heel Slides: AROM;10 reps;Right;Supine Hip ABduction/ADduction: Supine;10 reps;Right;AAROM Knee Flexion: AAROM;Right;10 reps;Supine   PT Diagnosis:    PT Problem List:   PT Treatment Interventions:     PT Goals Acute Rehab PT Goals PT Goal Formulation: With patient Time For Goal Achievement: 05/04/12 Potential to Achieve Goals: Good PT Goal: Sit to Supine/Side - Progress: Progressing toward goal PT Goal: Sit to Stand - Progress: Progressing toward goal PT Goal: Stand to Sit - Progress: Progressing toward goal PT Transfer Goal: Bed to Chair/Chair to Bed - Progress: Progressing toward goal PT Goal: Ambulate - Progress: Progressing toward goal PT Goal: Perform Home Exercise Program - Progress: Progressing toward goal  Visit Information  Last PT Received On: 04/29/12 Assistance Needed: +1    Subjective Data  Subjective: I am ready to get back in bed and take a nap. Agreeable to walk and exercise.  Patient Stated Goal: to go home with wife   Cognition  Cognition Overall Cognitive Status: Appears within functional limits for tasks assessed/performed Arousal/Alertness: Awake/alert Orientation Level: Appears intact for tasks assessed Behavior During Session: [facility] for tasks performed    Balance  Balance Balance Assessed: No  End of Session PT - End of Session Equipment  Utilized During Treatment: Gait belt;Right knee immobilizer Activity Tolerance: Patient tolerated treatment well Patient left: with call bell/phone within reach;in chair;Other (comment) (on CPM 0 to 60 degrees; tolerating well ) Nurse Communication: Mobility status   GP     Donell Sievert, [place] [phone] 04/29/2012, 3:20 PM

## 2012-04-29 NOTE — Evaluation (Signed)
Physical Therapy Evaluation Patient Details Name: Travis Collier MRN: [id number] DOB: July 14, 1950 Today's Date: 04/29/2012 Time: 8657-8469 PT Time Calculation (min): 30 min  PT Assessment / Plan / Recommendation Clinical Impression  Pt is a 62 y.o. male s/p R TKA POD#1. Pt is moving well this morning; can be impulsive at times with mobility and demo decreased safety awareness; correctable with cues. Presents with decreased mobility, decreased indpendence with transfers and decreased strength and ROM in R LE. Would benefit from skilled PT to improve mobility and function to retun to PLOF. Recommend HHPT upon acute D/C.      PT Assessment  Patient needs continued PT services    Follow Up Recommendations  Home health PT;Supervision/Assistance - 24 hour    Does the patient have the potential to tolerate intense rehabilitation      Barriers to Discharge        Equipment Recommendations  None recommended by PT    Recommendations for Other Services OT consult   Frequency 7X/week    Precautions / Restrictions Precautions Precautions: Knee;Fall Precaution Booklet Issued: Yes (comment) Required Braces or Orthoses: Knee Immobilizer - Right Knee Immobilizer - Right: On except when in CPM Restrictions Weight Bearing Restrictions: Yes RLE Weight Bearing: Weight bearing as tolerated   Pertinent Vitals/Pain 5/10 after treatment session; pt repositioned in chair.       Mobility  Bed Mobility Bed Mobility: Supine to Sit;Sitting - Scoot to Edge of Bed Supine to Sit: 4: Min guard;With rails Sitting - Scoot to Edge of Bed: 5: Supervision;With rail Details for Bed Mobility Assistance: pt is impulsive; requires cues to slow down and move safely; pt required vc's for hand placement  Transfers Transfers: Sit to Stand;Stand to Sit Sit to Stand: 4: Min assist;From bed;With upper extremity assist;From elevated surface Stand to Sit: 4: Min guard;To chair/3-in-1;With armrests;With upper extremity  assist Details for Transfer Assistance: Pt impulsive with transfers; required continuous cues for hand placement and safety with RW; pt tends to pull on RW for sit to stand transfer; pt encouraged to use Bil UEs to control descent to chair rather than flopping.  Ambulation/Gait Ambulation/Gait Assistance: 4: Min guard Ambulation Distance (Feet): 50 Feet Assistive device: Rolling walker Ambulation/Gait Assistance Details: required vc's for gt sequencing and RW safety; encouraged pt to weightshift on R LE; tactile and vc's for upright posture  Gait Pattern: Step-to pattern;Decreased stance time - right;Decreased step length - left Gait velocity: decreased Stairs: No Wheelchair Mobility Wheelchair Mobility: No    Exercises Total Joint Exercises Ankle Circles/Pumps: AROM;20 reps;Both Quad Sets: AROM;Right;10 reps;Supine Heel Slides: AROM;10 reps;Right;Supine   PT Diagnosis: Difficulty walking;Acute pain  PT Problem List: Decreased strength;Decreased range of motion;Decreased activity tolerance;Decreased balance;Decreased mobility;Decreased knowledge of use of DME;Decreased safety awareness;Decreased knowledge of precautions;Pain PT Treatment Interventions: DME instruction;Gait training;Stair training;Functional mobility training;Therapeutic activities;Therapeutic exercise;Balance training;Neuromuscular re-education;Patient/family education   PT Goals Acute Rehab PT Goals PT Goal Formulation: With patient Time For Goal Achievement: 05/04/12 Potential to Achieve Goals: Good Pt will go Supine/Side to Sit: with modified independence PT Goal: Supine/Side to Sit - Progress: Goal set today Pt will go Sit to Supine/Side: with modified independence PT Goal: Sit to Supine/Side - Progress: Goal set today Pt will go Sit to Stand: with modified independence PT Goal: Sit to Stand - Progress: Goal set today Pt will go Stand to Sit: with modified independence PT Goal: Stand to Sit - Progress: Goal set  today Pt will Transfer Bed to Chair/Chair to Bed: with supervision PT  Transfer Goal: Bed to Chair/Chair to Bed - Progress: Goal set today Pt will Ambulate: >150 feet;with supervision;with rolling walker PT Goal: Ambulate - Progress: Goal set today Pt will Go Up / Down Stairs: 1-2 stairs;with supervision;with rolling walker PT Goal: Up/Down Stairs - Progress: Goal set today Pt will Perform Home Exercise Program: Independently PT Goal: Perform Home Exercise Program - Progress: Goal set today  Visit Information  Last PT Received On: 04/29/12 Assistance Needed: +1    Subjective Data  Subjective: I have had a lot of pain in this machine. I hope to go home tomorrow morning.  Patient Stated Goal: to go home with wife   Prior Functioning  Home Living Lives With: Spouse Available Help at Discharge: Family;Available 24 hours/day Type of Home: House Home Access: Stairs to enter [employer] of Steps: 2 Entrance Stairs-Rails: None Home Layout: One level Bathroom Shower/Tub: Health visitor: Standard Bathroom Accessibility: Yes How Accessible: Accessible via walker Home Adaptive Equipment: Bedside commode/3-in-1;Built-in shower seat;Walker - rolling Prior Function Level of Independence: Independent Able to Take Stairs?: Yes Driving: Yes Vocation: On disability Communication Communication: No difficulties Dominant Hand: Right    Cognition  Cognition Overall Cognitive Status: Appears within functional limits for tasks assessed/performed Arousal/Alertness: Awake/alert Orientation Level: Appears intact for tasks assessed Behavior During Session: [facility] for tasks performed    Extremity/Trunk Assessment Right Lower Extremity Assessment RLE ROM/Strength/Tone: Deficits RLE ROM/Strength/Tone Deficits: required assistance to perform SLR in supine position; decreased secondary to pain and surgery RLE Sensation: WFL - Light Touch Left Lower Extremity Assessment LLE  ROM/Strength/Tone: Within functional levels LLE Sensation: WFL - Light Touch Trunk Assessment Trunk Assessment: Normal   Balance Balance Balance Assessed: No  End of Session PT - End of Session Equipment Utilized During Treatment: Gait belt;Right knee immobilizer Activity Tolerance: Patient tolerated treatment well Patient left: in chair;with call bell/phone within reach;with family/visitor present Nurse Communication: Mobility status CPM Right Knee CPM Right Knee: [address]  GP     Donnamarie Poag [place], [place] [phone] 04/29/2012, 10:12 AM

## 2012-04-29 NOTE — Op Note (Signed)
NAMEYOSGAR, DEMIRJIAN NO.:  [id number]  MEDICAL RECORD NO.:  [id number]  LOCATION:  5N25C                        FACILITY:  [facility]  PHYSICIAN:  Loreta Ave, M.D. DATE OF BIRTH:  Aug 28, 1950  DATE OF PROCEDURE:  04/28/2012 DATE OF DISCHARGE:                              OPERATIVE REPORT   PREOPERATIVE DIAGNOSIS:  Right knee end-stage degenerative arthritis, varus alignment.  POSTOPERATIVE DIAGNOSIS:  Right knee end-stage degenerative arthritis, varus alignment.  PROCEDURE:  Right knee modified minimally invasive total knee replacement, Stryker triathlon prosthesis, soft tissue balancing, cemented pegged posterior stabilized #7 femoral component, cemented #7 tibial component, 9-mm polyethylene insert, cemented resurfacing 40-mm patellar component.  SURGEON:  Loreta Ave, M.D.  ASSISTANT:  Genene Churn. Denton Meek., present throughout the entire case and necessary for timely completion of procedure.  ANESTHESIA:  General.  ESTIMATED BLOOD LOSS:  Minimal.  SPECIMENS:  None.  CULTURES:  None.  COMPLICATIONS:  None.  DRESSINGS:  Soft compressive, knee immobilizer.  DRAINS:  Hemovac x1.  TOURNIQUET TIME:  1 hour.  DESCRIPTION OF PROCEDURE:  The patient was brought to the operating room, placed on the operating table in supine position.  After adequate anesthesia had been obtained, thigh tourniquet applied.  Prepped and draped in usual sterile fashion.  Exsanguinated with elevation of Esmarch.  Tourniquet inflated at 350 mmHg.  Straight incision above the patella down to tibial tubercle.  Hemostasis with cautery.  Medial arthrotomy, vastus splitting.  Preserving quad tendon.  Medial capsule release.  Grade 4 change throughout.  Remnants of menisci, cruciate ligaments, loose body spurs removed.  Intramedullary guide distal femur. A 10-mm resection, 5 degrees of valgus.  Using epicondylar axis, the femur was sized, cut, and fitted for posterior  stabilized pegged #7 component.  Proximal tibial resection below the defect medially, sized to a #7 component as well.  Debris cleared throughout the knee.  Patella exposed, posterior 11-mm removed.  Drilled, sized, and fitted for a 40- mm component.  Copious irrigation with a pulse lavage.  Trials put in place.  A #7 on the femur, 7 on the tibia, 9 insert, and a 40 patella. With this construct, nicely balanced knee, good biomechanical axis. Good patellofemoral tracking.  Balance in flexion and extension.  Full motion.  Tibia was marked for rotation and reamed.  All trials removed. Copious irrigation with a pulse irrigating device.  Cement prepared, placed on all components, firmly seated.  Polyethylene attached to tibia and knee reduced.  Patella held with a clamp.  Once cement hardened, the knee was irrigated once again.  Hemovac was brought out through a separate stab wound.  The soft tissues injected with Exparel. Arthrotomy closed with #1 Vicryl.  Skin and subcutaneous tissue with Vicryl and staples.  Sterile compressive dressing applied.  Tourniquet deflated and removed.  Knee immobilizer applied.  Anesthesia reversed. Brought to the recovery room.  Tolerated the surgery well.  No complications.     Loreta Ave, M.D.     DFM/MEDQ  D:  04/28/2012  T:  04/29/2012  Job:  [id number]

## 2012-04-30 LAB — BASIC METABOLIC PANEL
BUN: 12 mg/dL (ref 6–23)
CO2: 32 mEq/L (ref 19–32)
Chloride: 97 mEq/L (ref 96–112)
Creatinine, Ser: 1.02 mg/dL (ref 0.50–1.35)
Glucose, Bld: 124 mg/dL — ABNORMAL HIGH (ref 70–99)

## 2012-04-30 LAB — CBC
HCT: 33.3 % — ABNORMAL LOW (ref 39.0–52.0)
Hemoglobin: 11.8 g/dL — ABNORMAL LOW (ref 13.0–17.0)
MCH: 30 pg (ref 26.0–34.0)
MCV: 84.7 fL (ref 78.0–100.0)
RBC: 3.93 MIL/uL — ABNORMAL LOW (ref 4.22–5.81)
WBC: 13.8 10*3/uL — ABNORMAL HIGH (ref 4.0–10.5)

## 2012-04-30 MED ORDER — WARFARIN SODIUM 5 MG PO TABS: 5.0000 mg | ORAL_TABLET | Freq: Every day | ORAL | Status: DC

## 2012-04-30 MED ORDER — HYDROCODONE-ACETAMINOPHEN 10-325 MG PO TABS: 1.0000 | ORAL_TABLET | ORAL | Status: DC | PRN

## 2012-04-30 MED ORDER — ENOXAPARIN SODIUM 30 MG/0.3ML ~~LOC~~ SOLN: 30.0000 mg | Freq: Two times a day (BID) | SUBCUTANEOUS | Status: DC

## 2012-04-30 MED ORDER — WARFARIN SODIUM 7.5 MG PO TABS
7.5000 mg | ORAL_TABLET | Freq: Once | ORAL | Status: DC
  Filled 2012-04-30: qty 1

## 2012-04-30 MED ORDER — METHOCARBAMOL 750 MG PO TABS: 750.0000 mg | ORAL_TABLET | Freq: Four times a day (QID) | ORAL | Status: DC | PRN

## 2012-04-30 NOTE — Progress Notes (Signed)
Seen and agreed with above 04/30/2012 Fredrich Birks PTA [phone] pager [phone] office

## 2012-04-30 NOTE — Care Management Note (Signed)
CARE MANAGEMENT NOTE 04/30/2012  Patient:  Travis Collier, Travis Collier   Account Number:  [id number]  Date Initiated:  04/30/2012  Documentation initiated by:  Vance Peper  Subjective/Objective Assessment:   62 yr old male s/p right total knee arthroplasty     Action/Plan:   CM spooke with patient and wife concerning home health and DME needs at discharge. Patient is under workers comp.CM spoke with Trula Ore with [facility]- they will contact patient re: HHRN and PT. Patient has rolling walker, 3in1 and CPM.   Anticipated DC Date:  04/30/2012   Anticipated DC Plan:  HOME W HOME HEALTH SERVICES      DC Planning Services  CM consult      [facility] Choice  HOME HEALTH   Choice offered to / List presented to:          [facility] arranged  HH-1 RN  HH-2 PT      [facility] agency  OTHER - SEE NOTE   Status of service:  Completed, signed off Medicare Important Message given?   (If response is "NO", the following Medicare IM given date fields will be blank) Date Medicare IM given:   Date Additional Medicare IM given:    Discharge Disposition:  HOME W HOME HEALTH SERVICES  Per UR Regulation:    If discussed at Long Length of Stay Meetings, dates discussed:    Comments:

## 2012-04-30 NOTE — Progress Notes (Signed)
ANTICOAGULATION CONSULT NOTE - Follow Up Consult  Pharmacy Consult for Coumadin Indication: VTE prophylaxis  No Known Allergies  Vital Signs: Temp: 99 F (37.2 C) (04/11 0537) Temp src: Oral (04/11 0537) BP: 129/62 mmHg (04/11 0537) Pulse Rate: 81 (04/11 0537)  Labs:  Recent Labs  04/28/12 1609 04/29/12 0550 04/30/12 0645  HGB 13.9 12.5* 11.8*  HCT 39.8 35.9* 33.3*  PLT 234 222 196  LABPROT  --  13.6 17.0*  INR  --  1.05 1.42  CREATININE 1.02 1.08 1.02    Estimated Creatinine Clearance: 107.9 ml/min (by C-G formula based on Cr of 1.02).   Assessment: 63 YOM s/p R TKA, on coumadin for VTE prophylaxis, INR trending up nicely 1.05 > 1.42 after 2 doses of coumaidn 7.5mg . Also on lovenox 30mg  sq q 12hrs. CBC stable, no bleeding noted   Goal of Therapy:  INR 2-3 Monitor platelets by anticoagulation protocol: Yes   Plan:  - Coumadin 7.5mg  po x 1 - f/u INR  Bayard Hugger, PharmD, BCPS  Clinical Pharmacist  Pager: [phone]   04/30/2012,10:06 AM

## 2012-04-30 NOTE — Progress Notes (Signed)
Pt and wife watched lovenox video verbalized understanding .Wife to give injections

## 2012-04-30 NOTE — Progress Notes (Signed)
Physical Therapy Treatment Patient Details Name: Travis Collier MRN: [id number] DOB: 11/13/1950 Today's Date: 04/30/2012 Time: 0829-0901 PT Time Calculation (min): 32 min  PT Assessment / Plan / Recommendation Comments on Treatment Session  Patient progressing embulation.  Somewhat impulsive and reluctant to follow cues.  Accomplished stairs.  Increased ther ex.    Follow Up Recommendations  Home health PT;Supervision/Assistance - 24 hour     Does the patient have the potential to tolerate intense rehabilitation     Barriers to Discharge        Equipment Recommendations  None recommended by PT    Recommendations for Other Services    Frequency 7X/week   Plan Discharge plan remains appropriate;Frequency remains appropriate    Precautions / Restrictions Precautions Precautions: Knee;Fall Precaution Booklet Issued: No Required Braces or Orthoses: Knee Immobilizer - Right Restrictions Weight Bearing Restrictions: Yes RLE Weight Bearing: Weight bearing as tolerated   Pertinent Vitals/Pain no apparent distress     Mobility  Bed Mobility Bed Mobility: Supine to Sit Supine to Sit: 4: Min guard;With rails Sitting - Scoot to Edge of Bed: 5: Supervision Transfers Transfers: Sit to Stand;Stand to Sit Sit to Stand: 4: Min guard;From bed;With upper extremity assist Stand to Sit: 5: Supervision;To chair/3-in-1;With upper extremity assist;With armrests Details for Transfer Assistance: Patient eager to stand.  Cues to wait for guarding.   Ambulation/Gait Ambulation/Gait Assistance: 4: Min guard Ambulation Distance (Feet): 200 Feet Assistive device: Rolling walker Ambulation/Gait Assistance Details: Patient continues to amb with step to gt.  Patient encouraged to shift weight through LE.  VC for upright posture and RW. Gait Pattern: Step-to pattern;Decreased stance time - right;Decreased step length - left;Trunk flexed Gait velocity: Decreased. Stairs: Yes Stairs Assistance: 4:  Min assist Stairs Assistance Details (indicate cue type and reason): Patient stated he will not follow cues at home.  Educated on safe technique.  Min assist for walker.  Cuing for techniuqe.   Stair Management Technique: Backwards;With walker;No rails Number of Stairs: 2 Wheelchair Mobility Wheelchair Mobility: No    Exercises Total Joint Exercises Heel Slides: Right;AAROM;15 reps;Seated Straight Leg Raises: AAROM;Right;15 reps Long Arc Quad: AAROM;Right;15 reps   PT Diagnosis:    PT Problem List:   PT Treatment Interventions:     PT Goals Acute Rehab PT Goals PT Goal: Supine/Side to Sit - Progress: Progressing toward goal PT Goal: Sit to Stand - Progress: Progressing toward goal PT Goal: Stand to Sit - Progress: Progressing toward goal PT Goal: Ambulate - Progress: Progressing toward goal PT Goal: Up/Down Stairs - Progress: Progressing toward goal PT Goal: Perform Home Exercise Program - Progress: Progressing toward goal  Visit Information  Last PT Received On: 04/30/12 Assistance Needed: +1    Subjective Data      Cognition  Cognition Overall Cognitive Status: Appears within functional limits for tasks assessed/performed Arousal/Alertness: Awake/alert Orientation Level: Appears intact for tasks assessed Behavior During Session: [facility] for tasks performed    Balance     End of Session PT - End of Session Equipment Utilized During Treatment: Gait belt;Right knee immobilizer Activity Tolerance: Patient tolerated treatment well Patient left: in chair;with call bell/phone within reach CPM Right Knee CPM Right Knee: Off   GP     Talley Kreiser JEAN SPTA. 04/30/2012, 9:17 AM

## 2012-04-30 NOTE — Progress Notes (Signed)
Subjective: Doing well.  Pain controlled.  Did great with therapy.  Ready to go home.    Objective: Vital signs in last 24 hours: Temp:  [99 F (37.2 C)-99.9 F (37.7 C)] 99 F (37.2 C) (04/11 0537) Pulse Rate:  [81-94] 81 (04/11 0537) Resp:  [16-18] 16 (04/11 0537) BP: (129-139)/(62-71) 129/62 mmHg (04/11 1020) SpO2:  [94 %-98 %] 98 % (04/11 0537) Weight:  [121.11 kg (267 lb)] 121.11 kg (267 lb) (04/10 2020)  Intake/Output from previous day: 04/10 0701 - 04/11 0700 In: 720 [P.O.:720] Out: 1125 [Urine:1100; Drains:25] Intake/Output this shift: Total I/O In: 240 [P.O.:240] Out: -    Recent Labs  04/28/12 1609 04/29/12 0550 04/30/12 0645  HGB 13.9 12.5* 11.8*    Recent Labs  04/29/12 0550 04/30/12 0645  WBC 12.3* 13.8*  RBC 4.22 3.93*  HCT 35.9* 33.3*  PLT 222 196    Recent Labs  04/29/12 0550 04/30/12 0645  NA 134* 135  K 3.8 3.4*  CL 97 97  CO2 30 32  BUN 15 12  CREATININE 1.08 1.02  GLUCOSE 141* 124*  CALCIUM 9.1 8.8    Recent Labs  04/29/12 0550 04/30/12 0645  INR 1.05 1.42    Exam:  Wound looks good.  Staples intact.  No drainage or signs of infection.  Calf nt, nvi.  Drain removed.    Assessment/Plan: D/c home today.  F/u 2 weeks postop.     Ciella Obi M 04/30/2012, 11:41 AM

## 2012-05-11 ENCOUNTER — Other Ambulatory Visit: Payer: Self-pay | Admitting: Orthopedic Surgery

## 2012-05-11 ENCOUNTER — Other Ambulatory Visit: Payer: Self-pay

## 2012-05-11 DIAGNOSIS — R52 Pain, unspecified: Secondary | ICD-10-CM

## 2012-05-11 DIAGNOSIS — R609 Edema, unspecified: Secondary | ICD-10-CM

## 2012-05-12 ENCOUNTER — Ambulatory Visit
Admission: RE | Admit: 2012-05-12 | Discharge: 2012-05-12 | Disposition: A | Discharge status: Home / Self Care | Payer: Worker's Compensation | Source: Ambulatory Visit | Attending: Orthopedic Surgery | Admitting: Orthopedic Surgery

## 2012-05-12 DIAGNOSIS — R609 Edema, unspecified: Secondary | ICD-10-CM

## 2012-05-12 DIAGNOSIS — R52 Pain, unspecified: Secondary | ICD-10-CM

## 2012-05-21 NOTE — Discharge Summary (Signed)
  ABBREVIATED DISCHARGE SUMMARY      DATE OF HOSPITALIZATION: 28 April 2012  REASON FOR HOSPITALIZATION: 62 yo wm with hx of end stage djd right knee and pain.  Failed conservative treatment.     SIGNIFICANT FINDINGS:  DJD  OPERATION:  Right total knee replacement  FINAL DIAGNOSIS:  same  SECONDARY DIAGNOSIS: none  CONSULTANTS:  none  DISCHARGE CONDITION:  STABLE  DISCHARGED TO:  HOME

## 2012-08-10 ENCOUNTER — Other Ambulatory Visit: Payer: Self-pay | Admitting: Orthopedic Surgery

## 2012-08-23 ENCOUNTER — Other Ambulatory Visit: Payer: Self-pay | Admitting: Orthopedic Surgery

## 2012-08-23 DIAGNOSIS — M25512 Pain in left shoulder: Secondary | ICD-10-CM

## 2012-08-27 ENCOUNTER — Ambulatory Visit
Admission: RE | Admit: 2012-08-27 | Discharge: 2012-08-27 | Disposition: A | Discharge status: Home / Self Care | Payer: Worker's Compensation | Source: Ambulatory Visit | Attending: Orthopedic Surgery | Admitting: Orthopedic Surgery

## 2012-08-27 DIAGNOSIS — M25512 Pain in left shoulder: Secondary | ICD-10-CM

## 2012-09-01 ENCOUNTER — Ambulatory Visit
Admission: RE | Admit: 2012-09-01 | Discharge: 2012-09-01 | Disposition: A | Discharge status: Home / Self Care | Payer: Worker's Compensation | Source: Ambulatory Visit | Attending: Orthopedic Surgery | Admitting: Orthopedic Surgery

## 2013-03-15 ENCOUNTER — Other Ambulatory Visit: Payer: Self-pay | Admitting: Physician Assistant
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
## 2013-03-17 ENCOUNTER — Encounter (HOSPITAL_COMMUNITY): Payer: Self-pay | Admitting: Pharmacy Technician

## 2013-03-22 ENCOUNTER — Encounter (HOSPITAL_COMMUNITY): Payer: Self-pay

## 2013-03-22 ENCOUNTER — Encounter (HOSPITAL_COMMUNITY)
Admission: RE | Admit: 2013-03-22 | Discharge: 2013-03-22 | Disposition: A | Discharge status: Home / Self Care | Payer: Worker's Compensation | Source: Ambulatory Visit | Attending: Orthopedic Surgery | Admitting: Orthopedic Surgery

## 2013-03-22 DIAGNOSIS — Z01812 Encounter for preprocedural laboratory examination: Secondary | ICD-10-CM | POA: Insufficient documentation

## 2013-03-22 DIAGNOSIS — Z01818 Encounter for other preprocedural examination: Secondary | ICD-10-CM | POA: Insufficient documentation

## 2013-03-22 HISTORY — DX: Personal history of other diseases of the musculoskeletal system and connective tissue: Z87.39

## 2013-03-22 HISTORY — DX: Pain in unspecified joint: M25.50

## 2013-03-22 HISTORY — DX: Low back pain, unspecified: M54.50

## 2013-03-22 HISTORY — DX: Headache: R51

## 2013-03-22 HISTORY — DX: Low back pain: M54.5

## 2013-03-22 LAB — URINALYSIS, ROUTINE W REFLEX MICROSCOPIC
Bilirubin Urine: NEGATIVE
GLUCOSE, UA: NEGATIVE mg/dL
Hgb urine dipstick: NEGATIVE
Ketones, ur: NEGATIVE mg/dL
LEUKOCYTES UA: NEGATIVE
NITRITE: NEGATIVE
PH: 6 (ref 5.0–8.0)
PROTEIN: NEGATIVE mg/dL
Specific Gravity, Urine: 1.015 (ref 1.005–1.030)
Urobilinogen, UA: 0.2 mg/dL (ref 0.0–1.0)

## 2013-03-22 LAB — CBC WITH DIFFERENTIAL/PLATELET
BASOS ABS: 0 10*3/uL (ref 0.0–0.1)
BASOS PCT: 1 % (ref 0–1)
Eosinophils Absolute: 0.1 10*3/uL (ref 0.0–0.7)
Eosinophils Relative: 1 % (ref 0–5)
HEMATOCRIT: 47.3 % (ref 39.0–52.0)
Hemoglobin: 16.8 g/dL (ref 13.0–17.0)
Lymphocytes Relative: 28 % (ref 12–46)
Lymphs Abs: 2.4 10*3/uL (ref 0.7–4.0)
MCH: 30.4 pg (ref 26.0–34.0)
MCHC: 35.5 g/dL (ref 30.0–36.0)
MCV: 85.7 fL (ref 78.0–100.0)
MONO ABS: 1 10*3/uL (ref 0.1–1.0)
Monocytes Relative: 11 % (ref 3–12)
NEUTROS ABS: 5 10*3/uL (ref 1.7–7.7)
NEUTROS PCT: 59 % (ref 43–77)
PLATELETS: 228 10*3/uL (ref 150–400)
RBC: 5.52 MIL/uL (ref 4.22–5.81)
RDW: 12.9 % (ref 11.5–15.5)
WBC: 8.5 10*3/uL (ref 4.0–10.5)

## 2013-03-22 LAB — APTT: APTT: 34 s (ref 24–37)

## 2013-03-22 LAB — TYPE AND SCREEN
ABO/RH(D): O NEG
Antibody Screen: NEGATIVE

## 2013-03-22 LAB — SURGICAL PCR SCREEN
MRSA, PCR: NEGATIVE
STAPHYLOCOCCUS AUREUS: NEGATIVE

## 2013-03-22 LAB — COMPREHENSIVE METABOLIC PANEL
ALT: 25 U/L (ref 0–53)
AST: 23 U/L (ref 0–37)
Albumin: 3.9 g/dL (ref 3.5–5.2)
Alkaline Phosphatase: 107 U/L (ref 39–117)
BILIRUBIN TOTAL: 0.9 mg/dL (ref 0.3–1.2)
BUN: 12 mg/dL (ref 6–23)
CHLORIDE: 102 meq/L (ref 96–112)
CO2: 24 mEq/L (ref 19–32)
Calcium: 9.4 mg/dL (ref 8.4–10.5)
Creatinine, Ser: 1.06 mg/dL (ref 0.50–1.35)
GFR calc Af Amer: 86 mL/min — ABNORMAL LOW (ref >90)
GFR, EST NON AFRICAN AMERICAN: 74 mL/min — AB (ref >90)
Glucose, Bld: 128 mg/dL — ABNORMAL HIGH (ref 70–99)
Potassium: 3.8 mEq/L (ref 3.7–5.3)
SODIUM: 139 meq/L (ref 137–147)
Total Protein: 7.9 g/dL (ref 6.0–8.3)

## 2013-03-22 LAB — PROTIME-INR
INR: 1.01 (ref 0.00–1.49)
Prothrombin Time: 13.1 seconds (ref 11.6–15.2)

## 2013-03-22 MED ORDER — CHLORHEXIDINE GLUCONATE 4 % EX LIQD: 60.0000 mL | Freq: Once | CUTANEOUS | Status: DC

## 2013-03-22 NOTE — Progress Notes (Addendum)
  Pt doesn't have a cardiologist  Stress test done >6219yrs ago  Denies ever having an echo or heart cath     EKG and CXR in epic from 04-22-12  Pt doesn't have a medical Md

## 2013-03-22 NOTE — Pre-Procedure Instructions (Signed)
Travis SkeetersJohnie Collier  03/22/2013   Your procedure is scheduled on:  Wed, Mar 11 @ 8:30 AM  [facility] 6:30 AM.  Call this number if you have problems the morning of surgery: [phone]   Remember:   Do not eat food or drink liquids after midnight.                 Stop taking your Ibuprofen. No Goody's,BC's,Aleve,Aspirin,Fish Oil,or any Herbal Medications   Do not wear jewelry  Do not wear lotions, powders, or colognes. You may wear deodorant.  Men may shave face and neck.  Do not bring valuables to the hospital.  [facility] is not responsible                  for any belongings or valuables.               Contacts, dentures or bridgework may not be worn into surgery.  Leave suitcase in the car. After surgery it may be brought to your room.  For patients admitted to the hospital, discharge time is determined by your                treatment team.               Special Instructions:  [place] - Preparing for Surgery  Before surgery, you can play an important role.  Because skin is not sterile, your skin needs to be as free of germs as possible.  You can reduce the number of germs on you skin by washing with CHG (chlorahexidine gluconate) soap before surgery.  CHG is an antiseptic cleaner which kills germs and bonds with the skin to continue killing germs even after washing.  Please DO NOT use if you have an allergy to CHG or antibacterial soaps.  If your skin becomes reddened/irritated stop using the CHG and inform your nurse when you arrive at Short Stay.  Do not shave (including legs and underarms) for at least 48 hours prior to the first CHG shower.  You may shave your face.  Please follow these instructions carefully:   1.  Shower with CHG Soap the night before surgery and the                                morning of Surgery.  2.  If you choose to wash your hair, wash your hair first as usual with your       normal shampoo.  3.  After you shampoo, rinse  your hair and body thoroughly to remove the                      Shampoo.  4.  Use CHG as you would any other liquid soap.  You can apply chg directly       to the skin and wash gently with scrungie or a clean washcloth.  5.  Apply the CHG Soap to your body ONLY FROM THE NECK DOWN.        Do not use on open wounds or open sores.  Avoid contact with your eyes,       ears, mouth and genitals (private parts).  Wash genitals (private parts)       with your normal soap.  6.  Wash thoroughly, paying special attention to the area where your surgery  will be performed.  7.  Thoroughly rinse your body with warm water from the neck down.  8.  DO NOT shower/wash with your normal soap after using and rinsing off       the CHG Soap.  9.  Pat yourself dry with a clean towel.            10.  Wear clean pajamas.            11.  Place clean sheets on your bed the night of your first shower and do not        sleep with pets.  Day of Surgery  Do not apply any lotions/deoderants the morning of surgery.  Please wear clean clothes to the hospital/surgery center.     Please read over the following fact sheets that you were given: Pain Booklet, Coughing and Deep Breathing, Blood Transfusion Information, MRSA Information and Surgical Site Infection Prevention

## 2013-03-23 LAB — URINE CULTURE
CULTURE: NO GROWTH
Colony Count: NO GROWTH

## 2013-03-24 NOTE — Progress Notes (Signed)
Urine culture shows "no growth"  DA

## 2013-03-29 MED ORDER — DEXTROSE 5 % IV SOLN
3.0000 g | INTRAVENOUS | Status: AC
  Administered 2013-03-30: 3 g via INTRAVENOUS
  Filled 2013-03-29: qty 3000

## 2013-03-30 ENCOUNTER — Encounter (HOSPITAL_COMMUNITY): Payer: Self-pay | Admitting: Anesthesiology

## 2013-03-30 ENCOUNTER — Inpatient Hospital Stay (HOSPITAL_COMMUNITY): Payer: Worker's Compensation | Admitting: Anesthesiology

## 2013-03-30 ENCOUNTER — Inpatient Hospital Stay (HOSPITAL_COMMUNITY): Payer: Worker's Compensation

## 2013-03-30 ENCOUNTER — Inpatient Hospital Stay (HOSPITAL_COMMUNITY)
Admission: RE | Admit: 2013-03-30 | Discharge: 2013-03-31 | DRG: 489 | Disposition: A | Discharge status: Home Health | Payer: Worker's Compensation | Source: Ambulatory Visit | Attending: Orthopedic Surgery | Admitting: Orthopedic Surgery

## 2013-03-30 ENCOUNTER — Encounter (HOSPITAL_COMMUNITY)
Admission: RE | Disposition: A | Discharge status: Home Health | Payer: Self-pay | Source: Ambulatory Visit | Attending: Orthopedic Surgery

## 2013-03-30 ENCOUNTER — Encounter (HOSPITAL_COMMUNITY): Payer: Worker's Compensation | Admitting: Anesthesiology

## 2013-03-30 DIAGNOSIS — Y831 Surgical operation with implant of artificial internal device as the cause of abnormal reaction of the patient, or of later complication, without mention of misadventure at the time of the procedure: Secondary | ICD-10-CM | POA: Diagnosis present

## 2013-03-30 DIAGNOSIS — Z7982 Long term (current) use of aspirin: Secondary | ICD-10-CM

## 2013-03-30 DIAGNOSIS — I10 Essential (primary) hypertension: Secondary | ICD-10-CM | POA: Diagnosis present

## 2013-03-30 DIAGNOSIS — M25569 Pain in unspecified knee: Secondary | ICD-10-CM | POA: Diagnosis present

## 2013-03-30 DIAGNOSIS — T8489XA Other specified complication of internal orthopedic prosthetic devices, implants and grafts, initial encounter: Principal | ICD-10-CM | POA: Diagnosis present

## 2013-03-30 DIAGNOSIS — M1710 Unilateral primary osteoarthritis, unspecified knee: Secondary | ICD-10-CM | POA: Diagnosis present

## 2013-03-30 DIAGNOSIS — M109 Gout, unspecified: Secondary | ICD-10-CM | POA: Diagnosis present

## 2013-03-30 DIAGNOSIS — Y92009 Unspecified place in unspecified non-institutional (private) residence as the place of occurrence of the external cause: Secondary | ICD-10-CM

## 2013-03-30 DIAGNOSIS — M179 Osteoarthritis of knee, unspecified: Secondary | ICD-10-CM

## 2013-03-30 DIAGNOSIS — Z79899 Other long term (current) drug therapy: Secondary | ICD-10-CM

## 2013-03-30 DIAGNOSIS — Z96659 Presence of unspecified artificial knee joint: Secondary | ICD-10-CM

## 2013-03-30 DIAGNOSIS — Z87891 Personal history of nicotine dependence: Secondary | ICD-10-CM

## 2013-03-30 HISTORY — PX: TOTAL KNEE REVISION: SHX996

## 2013-03-30 SURGERY — TOTAL KNEE REVISION
Anesthesia: General | Site: Knee | Laterality: Right

## 2013-03-30 MED ORDER — OXYCODONE HCL 5 MG/5ML PO SOLN: 5.0000 mg | Freq: Once | ORAL | Status: AC | PRN

## 2013-03-30 MED ORDER — MIDAZOLAM HCL 2 MG/2ML IJ SOLN: 1.0000 mg | INTRAMUSCULAR | Status: DC | PRN

## 2013-03-30 MED ORDER — LABETALOL HCL 5 MG/ML IV SOLN
INTRAVENOUS | Status: AC
  Filled 2013-03-30: qty 4

## 2013-03-30 MED ORDER — METOCLOPRAMIDE HCL 10 MG PO TABS: 5.0000 mg | ORAL_TABLET | Freq: Three times a day (TID) | ORAL | Status: DC | PRN

## 2013-03-30 MED ORDER — HYDROMORPHONE HCL PF 1 MG/ML IJ SOLN
INTRAMUSCULAR | Status: AC
  Filled 2013-03-30: qty 1

## 2013-03-30 MED ORDER — METHOCARBAMOL 500 MG PO TABS: 500.0000 mg | ORAL_TABLET | Freq: Four times a day (QID) | ORAL | Status: DC | PRN

## 2013-03-30 MED ORDER — FENTANYL CITRATE 0.05 MG/ML IJ SOLN
INTRAMUSCULAR | Status: AC
  Filled 2013-03-30: qty 5

## 2013-03-30 MED ORDER — PROMETHAZINE HCL 25 MG/ML IJ SOLN
6.2500 mg | INTRAMUSCULAR | Status: DC | PRN
Start: 2013-03-30 — End: 2013-03-30

## 2013-03-30 MED ORDER — PHENYLEPHRINE 40 MCG/ML (10ML) SYRINGE FOR IV PUSH (FOR BLOOD PRESSURE SUPPORT)
PREFILLED_SYRINGE | INTRAVENOUS | Status: AC
  Filled 2013-03-30: qty 10

## 2013-03-30 MED ORDER — ONDANSETRON HCL 4 MG PO TABS: 4.0000 mg | ORAL_TABLET | Freq: Four times a day (QID) | ORAL | Status: DC | PRN

## 2013-03-30 MED ORDER — POTASSIUM CHLORIDE IN NACL 20-0.9 MEQ/L-% IV SOLN
INTRAVENOUS | Status: DC
  Administered 2013-03-30 – 2013-03-31 (×2): via INTRAVENOUS
  Filled 2013-03-30 (×5): qty 1000

## 2013-03-30 MED ORDER — PROPOFOL 10 MG/ML IV BOLUS
INTRAVENOUS | Status: AC
  Filled 2013-03-30: qty 20

## 2013-03-30 MED ORDER — DIPHENHYDRAMINE HCL 12.5 MG/5ML PO ELIX: 12.5000 mg | ORAL_SOLUTION | ORAL | Status: DC | PRN

## 2013-03-30 MED ORDER — ASPIRIN 325 MG PO TBEC: 325.0000 mg | DELAYED_RELEASE_TABLET | Freq: Every day | ORAL | Status: AC

## 2013-03-30 MED ORDER — SODIUM CHLORIDE 0.9 % IJ SOLN
INTRAMUSCULAR | Status: AC
  Filled 2013-03-30: qty 10

## 2013-03-30 MED ORDER — ONDANSETRON HCL 4 MG/2ML IJ SOLN
4.0000 mg | Freq: Four times a day (QID) | INTRAMUSCULAR | Status: DC | PRN
  Administered 2013-03-30: 4 mg via INTRAVENOUS
  Filled 2013-03-30: qty 2

## 2013-03-30 MED ORDER — ONDANSETRON HCL 4 MG/2ML IJ SOLN
INTRAMUSCULAR | Status: AC
  Filled 2013-03-30: qty 2

## 2013-03-30 MED ORDER — METOCLOPRAMIDE HCL 5 MG/ML IJ SOLN: 5.0000 mg | Freq: Three times a day (TID) | INTRAMUSCULAR | Status: DC | PRN

## 2013-03-30 MED ORDER — FENTANYL CITRATE 0.05 MG/ML IJ SOLN
INTRAMUSCULAR | Status: AC
Start: 2013-03-30 — End: 2013-03-30
  Filled 2013-03-30: qty 5

## 2013-03-30 MED ORDER — PHENYLEPHRINE HCL 10 MG/ML IJ SOLN
INTRAMUSCULAR | Status: DC | PRN
  Administered 2013-03-30: 120 ug via INTRAVENOUS
  Administered 2013-03-30: 80 ug via INTRAVENOUS

## 2013-03-30 MED ORDER — OXYCODONE-ACETAMINOPHEN 5-325 MG PO TABS: 1.0000 | ORAL_TABLET | ORAL | Status: AC | PRN

## 2013-03-30 MED ORDER — MIDAZOLAM HCL 5 MG/5ML IJ SOLN
INTRAMUSCULAR | Status: DC | PRN
  Administered 2013-03-30: 2 mg via INTRAVENOUS

## 2013-03-30 MED ORDER — HYDROMORPHONE HCL PF 1 MG/ML IJ SOLN: 0.5000 mg | INTRAMUSCULAR | Status: DC | PRN

## 2013-03-30 MED ORDER — FENTANYL CITRATE 0.05 MG/ML IJ SOLN: 50.0000 ug | Freq: Once | INTRAMUSCULAR | Status: DC

## 2013-03-30 MED ORDER — ACETAMINOPHEN 650 MG RE SUPP: 650.0000 mg | Freq: Four times a day (QID) | RECTAL | Status: DC | PRN

## 2013-03-30 MED ORDER — CEFAZOLIN SODIUM-DEXTROSE 2-3 GM-% IV SOLR
2.0000 g | Freq: Four times a day (QID) | INTRAVENOUS | Status: AC
  Administered 2013-03-30 (×2): 2 g via INTRAVENOUS
  Filled 2013-03-30 (×2): qty 50

## 2013-03-30 MED ORDER — DOCUSATE SODIUM 100 MG PO CAPS
100.0000 mg | ORAL_CAPSULE | Freq: Two times a day (BID) | ORAL | Status: DC
  Administered 2013-03-30 – 2013-03-31 (×2): 100 mg via ORAL
  Filled 2013-03-30 (×3): qty 1

## 2013-03-30 MED ORDER — ONDANSETRON HCL 4 MG PO TABS: 4.0000 mg | ORAL_TABLET | Freq: Four times a day (QID) | ORAL | Status: AC | PRN

## 2013-03-30 MED ORDER — MENTHOL 3 MG MT LOZG
1.0000 | LOZENGE | OROMUCOSAL | Status: DC | PRN
Start: 2013-03-30 — End: 2013-03-31

## 2013-03-30 MED ORDER — HYDROMORPHONE HCL PF 1 MG/ML IJ SOLN
0.2500 mg | INTRAMUSCULAR | Status: DC | PRN
  Administered 2013-03-30: 0.5 mg via INTRAVENOUS
  Administered 2013-03-30 (×2): 0.25 mg via INTRAVENOUS

## 2013-03-30 MED ORDER — ASPIRIN EC 325 MG PO TBEC
325.0000 mg | DELAYED_RELEASE_TABLET | Freq: Every day | ORAL | Status: DC
  Administered 2013-03-31: 325 mg via ORAL
  Filled 2013-03-30 (×2): qty 1

## 2013-03-30 MED ORDER — ROCURONIUM BROMIDE 50 MG/5ML IV SOLN
INTRAVENOUS | Status: AC
  Filled 2013-03-30: qty 1

## 2013-03-30 MED ORDER — ACETAMINOPHEN 325 MG PO TABS: 650.0000 mg | ORAL_TABLET | Freq: Four times a day (QID) | ORAL | Status: DC | PRN

## 2013-03-30 MED ORDER — METHOCARBAMOL 500 MG PO TABS: 500.0000 mg | ORAL_TABLET | Freq: Four times a day (QID) | ORAL | Status: AC | PRN

## 2013-03-30 MED ORDER — BISACODYL 5 MG PO TBEC: 5.0000 mg | DELAYED_RELEASE_TABLET | Freq: Every day | ORAL | Status: DC | PRN

## 2013-03-30 MED ORDER — OXYCODONE HCL 5 MG PO TABS
ORAL_TABLET | ORAL | Status: AC
  Filled 2013-03-30: qty 1

## 2013-03-30 MED ORDER — PROPOFOL 10 MG/ML IV BOLUS
INTRAVENOUS | Status: DC | PRN
  Administered 2013-03-30: 200 mg via INTRAVENOUS

## 2013-03-30 MED ORDER — DEXAMETHASONE SODIUM PHOSPHATE 10 MG/ML IJ SOLN
10.0000 mg | Freq: Three times a day (TID) | INTRAMUSCULAR | Status: AC
  Filled 2013-03-30 (×3): qty 1

## 2013-03-30 MED ORDER — 0.9 % SODIUM CHLORIDE (POUR BTL) OPTIME
TOPICAL | Status: DC | PRN
  Administered 2013-03-30: 1000 mL

## 2013-03-30 MED ORDER — SODIUM CHLORIDE 0.9 % IJ SOLN
INTRAMUSCULAR | Status: DC | PRN
  Administered 2013-03-30: 40 mL

## 2013-03-30 MED ORDER — DEXAMETHASONE 6 MG PO TABS
10.0000 mg | ORAL_TABLET | Freq: Three times a day (TID) | ORAL | Status: AC
  Administered 2013-03-30 – 2013-03-31 (×3): 10 mg via ORAL
  Filled 2013-03-30 (×3): qty 1

## 2013-03-30 MED ORDER — OXYCODONE HCL 5 MG PO TABS: 5.0000 mg | ORAL_TABLET | ORAL | Status: DC | PRN

## 2013-03-30 MED ORDER — SODIUM CHLORIDE 0.9 % IR SOLN
Status: DC | PRN
  Administered 2013-03-30: 1000 mL

## 2013-03-30 MED ORDER — METHOCARBAMOL 100 MG/ML IJ SOLN
500.0000 mg | Freq: Four times a day (QID) | INTRAVENOUS | Status: DC | PRN
  Filled 2013-03-30: qty 5

## 2013-03-30 MED ORDER — PHENOL 1.4 % MT LIQD: 1.0000 | OROMUCOSAL | Status: DC | PRN

## 2013-03-30 MED ORDER — LIDOCAINE HCL (CARDIAC) 20 MG/ML IV SOLN
INTRAVENOUS | Status: DC | PRN
  Administered 2013-03-30: 80 mg via INTRAVENOUS

## 2013-03-30 MED ORDER — BUPIVACAINE LIPOSOME 1.3 % IJ SUSP
20.0000 mL | INTRAMUSCULAR | Status: DC
  Filled 2013-03-30: qty 20

## 2013-03-30 MED ORDER — BISACODYL 5 MG PO TBEC
5.0000 mg | DELAYED_RELEASE_TABLET | Freq: Every day | ORAL | Status: AC | PRN
Start: 2013-03-30 — End: ?

## 2013-03-30 MED ORDER — FENTANYL CITRATE 0.05 MG/ML IJ SOLN
INTRAMUSCULAR | Status: DC | PRN
  Administered 2013-03-30 (×3): 50 ug via INTRAVENOUS
  Administered 2013-03-30: 100 ug via INTRAVENOUS
  Administered 2013-03-30 (×2): 50 ug via INTRAVENOUS

## 2013-03-30 MED ORDER — ONDANSETRON HCL 4 MG/2ML IJ SOLN
INTRAMUSCULAR | Status: DC | PRN
  Administered 2013-03-30: 4 mg via INTRAVENOUS

## 2013-03-30 MED ORDER — MIDAZOLAM HCL 2 MG/2ML IJ SOLN
INTRAMUSCULAR | Status: AC
  Filled 2013-03-30: qty 2

## 2013-03-30 MED ORDER — BUPIVACAINE HCL (PF) 0.5 % IJ SOLN
INTRAMUSCULAR | Status: AC
  Filled 2013-03-30: qty 10

## 2013-03-30 MED ORDER — BUPIVACAINE LIPOSOME 1.3 % IJ SUSP
INTRAMUSCULAR | Status: DC | PRN
  Administered 2013-03-30: 20 mL

## 2013-03-30 MED ORDER — BUPIVACAINE HCL 0.5 % IJ SOLN
INTRAMUSCULAR | Status: DC | PRN
  Administered 2013-03-30: 9 mL

## 2013-03-30 MED ORDER — CELECOXIB 200 MG PO CAPS
200.0000 mg | ORAL_CAPSULE | Freq: Two times a day (BID) | ORAL | Status: DC
  Administered 2013-03-30 – 2013-03-31 (×3): 200 mg via ORAL
  Filled 2013-03-30 (×4): qty 1

## 2013-03-30 MED ORDER — LACTATED RINGERS IV SOLN
INTRAVENOUS | Status: DC | PRN
  Administered 2013-03-30 (×2): via INTRAVENOUS

## 2013-03-30 MED ORDER — LABETALOL HCL 5 MG/ML IV SOLN
INTRAVENOUS | Status: DC | PRN
  Administered 2013-03-30 (×2): 5 mg via INTRAVENOUS

## 2013-03-30 MED ORDER — EPHEDRINE SULFATE 50 MG/ML IJ SOLN
INTRAMUSCULAR | Status: AC
  Filled 2013-03-30: qty 1

## 2013-03-30 MED ORDER — OXYCODONE HCL 5 MG PO TABS
5.0000 mg | ORAL_TABLET | Freq: Once | ORAL | Status: AC | PRN
  Administered 2013-03-30: 5 mg via ORAL

## 2013-03-30 MED ORDER — LIDOCAINE HCL (CARDIAC) 20 MG/ML IV SOLN
INTRAVENOUS | Status: AC
  Filled 2013-03-30: qty 5

## 2013-03-30 SURGICAL SUPPLY — 64 items
BANDAGE ELASTIC 4 VELCRO ST LF (GAUZE/BANDAGES/DRESSINGS) ×3 IMPLANT
BANDAGE ELASTIC 6 VELCRO ST LF (GAUZE/BANDAGES/DRESSINGS) ×3 IMPLANT
BANDAGE ESMARK 6X9 LF (GAUZE/BANDAGES/DRESSINGS) ×1 IMPLANT
BLADE SAG 18X100X1.27 (BLADE) ×3 IMPLANT
BLADE SAW SGTL 13.0X1.19X90.0M (BLADE) ×3 IMPLANT
BLADE SURG ROTATE 9660 (MISCELLANEOUS) IMPLANT
BNDG ESMARK 6X9 LF (GAUZE/BANDAGES/DRESSINGS) ×3
BOWL SMART MIX CTS (DISPOSABLE) IMPLANT
COVER BACK TABLE 24X17X13 BIG (DRAPES) IMPLANT
COVER SURGICAL LIGHT HANDLE (MISCELLANEOUS) ×3 IMPLANT
CUFF TOURNIQUET SINGLE 34IN LL (TOURNIQUET CUFF) IMPLANT
DRAPE EXTREMITY T 121X128X90 (DRAPE) ×3 IMPLANT
DRAPE U-SHAPE 47X51 STRL (DRAPES) ×3 IMPLANT
DURAPREP 26ML APPLICATOR (WOUND CARE) ×3 IMPLANT
ELECT REM PT RETURN 9FT ADLT (ELECTROSURGICAL) ×3
ELECTRODE REM PT RTRN 9FT ADLT (ELECTROSURGICAL) ×1 IMPLANT
EVACUATOR 1/8 PVC DRAIN (DRAIN) IMPLANT
FACESHIELD LNG OPTICON STERILE (SAFETY) ×9 IMPLANT
GAUZE XEROFORM 1X8 LF (GAUZE/BANDAGES/DRESSINGS) ×3 IMPLANT
GLOVE BIO SURGEON STRL SZ 6.5 (GLOVE) ×2 IMPLANT
GLOVE BIO SURGEON STRL SZ8 (GLOVE) ×3 IMPLANT
GLOVE BIO SURGEONS STRL SZ 6.5 (GLOVE) ×1
GLOVE BIOGEL PI IND STRL 7.0 (GLOVE) ×1 IMPLANT
GLOVE BIOGEL PI IND STRL 8.5 (GLOVE) ×1 IMPLANT
GLOVE BIOGEL PI INDICATOR 7.0 (GLOVE) ×2
GLOVE BIOGEL PI INDICATOR 8.5 (GLOVE) ×2
GLOVE ORTHO TXT STRL SZ7.5 (GLOVE) ×3 IMPLANT
GOWN STRL REUS W/ TWL LRG LVL3 (GOWN DISPOSABLE) ×3 IMPLANT
GOWN STRL REUS W/TWL 2XL LVL3 (GOWN DISPOSABLE) ×3 IMPLANT
GOWN STRL REUS W/TWL LRG LVL3 (GOWN DISPOSABLE) ×6
HANDPIECE INTERPULSE COAX TIP (DISPOSABLE)
IMMOBILIZER KNEE 22 UNIV (SOFTGOODS) ×3 IMPLANT
KIT BASIN OR (CUSTOM PROCEDURE TRAY) ×3 IMPLANT
KIT ROOM TURNOVER OR (KITS) ×3 IMPLANT
KNEE INSERT TIB 7X13 (Insert) ×6 IMPLANT
MANIFOLD NEPTUNE II (INSTRUMENTS) ×3 IMPLANT
NS IRRIG 1000ML POUR BTL (IV SOLUTION) ×3 IMPLANT
PACK TOTAL JOINT (CUSTOM PROCEDURE TRAY) ×3 IMPLANT
PAD ABD 8X10 STRL (GAUZE/BANDAGES/DRESSINGS) ×3 IMPLANT
PAD ARMBOARD 7.5X6 YLW CONV (MISCELLANEOUS) ×6 IMPLANT
PAD CAST 4YDX4 CTTN HI CHSV (CAST SUPPLIES) ×1 IMPLANT
PADDING CAST COTTON 4X4 STRL (CAST SUPPLIES) ×2
PADDING CAST COTTON 6X4 STRL (CAST SUPPLIES) ×3 IMPLANT
SET HNDPC FAN SPRY TIP SCT (DISPOSABLE) IMPLANT
SPONGE GAUZE 4X4 12PLY (GAUZE/BANDAGES/DRESSINGS) ×3 IMPLANT
SUCTION FRAZIER TIP 10 FR DISP (SUCTIONS) ×3 IMPLANT
SUT VIC AB 0 CT1 27 (SUTURE)
SUT VIC AB 0 CT1 27XBRD ANBCTR (SUTURE) IMPLANT
SUT VIC AB 1 CTX 36 (SUTURE) ×2
SUT VIC AB 1 CTX36XBRD ANBCTR (SUTURE) ×1 IMPLANT
SUT VIC AB 2-0 CT1 27 (SUTURE) ×2
SUT VIC AB 2-0 CT1 TAPERPNT 27 (SUTURE) ×1 IMPLANT
SUT VIC AB 2-0 FS1 27 (SUTURE) ×6 IMPLANT
SUT VIC AB 2-0 SH 27 (SUTURE)
SUT VIC AB 2-0 SH 27XBRD (SUTURE) IMPLANT
SUT VIC AB 3-0 SH 27 (SUTURE)
SUT VIC AB 3-0 SH 27X BRD (SUTURE) IMPLANT
SUT VLOC 180 0 24IN GS25 (SUTURE) ×3 IMPLANT
SYR 50ML LL SCALE MARK (SYRINGE) ×3 IMPLANT
TOWEL OR 17X24 6PK STRL BLUE (TOWEL DISPOSABLE) ×3 IMPLANT
TOWEL OR 17X26 10 PK STRL BLUE (TOWEL DISPOSABLE) ×3 IMPLANT
TRAY FOLEY CATH 16FRSI W/METER (SET/KITS/TRAYS/PACK) IMPLANT
TUBE ANAEROBIC SPECIMEN COL (MISCELLANEOUS) ×3 IMPLANT
WATER STERILE IRR 1000ML POUR (IV SOLUTION) ×9 IMPLANT

## 2013-03-30 NOTE — Interval H&P Note (Signed)
History and Physical Interval Note:  03/30/2013 8:25 AM  [name]  has presented today for surgery, with the diagnosis of LOOSENING OF HARDWARE RIGHT TOTAL KNEE  The various methods of treatment have been discussed with the patient and family. After consideration of risks, benefits and other options for treatment, the patient has consented to  Procedure(s): TOTAL KNEE REVISION (Right) as a surgical intervention .  The patient's history has been reviewed, patient examined, no change in status, stable for surgery.  I have reviewed the patient's chart and labs.  Questions were answered to the patient's satisfaction.     Alexis Mizuno F
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
## 2013-03-30 NOTE — Transfer of Care (Signed)
Immediate Anesthesia Transfer of Care Note  Patient: Travis Collier  Procedure(s) Performed: Procedure(s): TOTAL KNEE REVISION- RIGHT (Right)  Patient Location: PACU  Anesthesia Type:General  Level of Consciousness: awake  Airway & Oxygen Therapy: Patient Spontanous Breathing and Patient connected to face mask oxygen  Post-op Assessment: Report given to PACU RN and Post -op Vital signs reviewed and stable  Post vital signs: Reviewed and stable  Complications: No apparent anesthesia complications

## 2013-03-30 NOTE — Discharge Instructions (Signed)
Total Knee Revision Care After Refer to this sheet in the next few weeks. These discharge instructions provide you with general information on caring for yourself after you leave the hospital. Your caregiver may also give you specific instructions. Your treatment has been planned according to the most current medical practices available, but unavoidable complications sometimes occur. If you have any problems or questions after discharge, please call your caregiver. Regaining a near full range of motion of your knee within the first 3 to 6 weeks after surgery is critical. HOME CARE INSTRUCTIONS  Weight bearing as tolerated.  May change dressing daily starting on Saturday.  May shower on Monday, but do not let incision get wet.  Take Asprin 1 tab a day for the next 30 days to prevent blood clots.  May ice for up to 20 minutes at a time for pain and swelling.  Follow up appointment in our office in two weeks. You may resume a normal diet and activities as directed.  Perform exercises as directed.  Place gray foam block, curve side up under heel at all times except when in CPM or when walking.  DO NOT modify, tear, cut, or change in any way the gray foam block. You will receive physical therapy daily  Take showers instead of baths until informed otherwise.  You may shower on Sunday.  Please wash whole leg including wound with soap and water  Change bandages (dressings)daily Oxycodone is VERY constipating.  Please take stool softener twice a day and laxatives daily until bowels are regular Eat a well-balanced diet.  Avoid lifting or driving until you are instructed otherwise.  Make an appointment to see your caregiver for stitches (suture) or staple removal as directed.  If you have been sent home with a continuous passive motion machine (CPM machine), 0-90 degrees 6 hrs a day   2 hrs a shift SEEK MEDICAL CARE IF: You have swelling of your calf or leg.  You develop shortness of breath or chest pain.    You have redness, swelling, or increasing pain in the wound.  There is pus or any unusual drainage coming from the surgical site.  You notice a bad smell coming from the surgical site or dressing.  The surgical site breaks open after sutures or staples have been removed.  There is persistent bleeding from the suture or staple line.  You are getting worse or are not improving.  You have any other questions or concerns.  SEEK IMMEDIATE MEDICAL CARE IF:  You have a fever.  You develop a rash.  You have difficulty breathing.  You develop any reaction or side effects to medicines given.  Your knee motion is decreasing rather than improving.  MAKE SURE YOU:  Understand these instructions.  Will watch your condition.  Will get help right away if you are not doing well or get worse.

## 2013-03-30 NOTE — Progress Notes (Signed)
Utilization review completed.  

## 2013-03-30 NOTE — Preoperative (Signed)
Beta Blockers   Reason not to administer Beta Blockers:Not Applicable 

## 2013-03-30 NOTE — Evaluation (Signed)
Physical Therapy Evaluation Patient Details Name: Travis SkeetersJohnie Rieth MRN: [id number] DOB: Sep 01, 1950 Today's Date: 03/30/2013 Time: [id number] PT Time Calculation (min): 29 min  PT Assessment / Plan / Recommendation History of Present Illness  s/p Right Total Knee Revision  Clinical Impression  Pt is s/p R Total knee revision resulting in the deficits listed below (see PT Problem List). Pt will benefit from skilled PT to increase their independence and safety with mobility to allow discharge to the venue listed below. Pt demonstrates good ability to stand and transfer with R knee immobilizer in place this afternoon and no instances of buckling, requiring Min A to steady walker.     PT Assessment  Patient needs continued PT services    Follow Up Recommendations  Home health PT;Supervision for mobility/OOB    Does the patient have the potential to tolerate intense rehabilitation      Barriers to Discharge        Equipment Recommendations  None recommended by PT    Recommendations for Other Services OT consult   Frequency 7X/week    Precautions / Restrictions Precautions Precautions: Knee Precaution Booklet Issued: Yes (comment) Required Braces or Orthoses: Knee Immobilizer - Right Restrictions Weight Bearing Restrictions: Yes RLE Weight Bearing: Weight bearing as tolerated   Pertinent Vitals/Pain  Pt reported pain at 3/10 Nurse aware and spoke with pt during therapy session Pt repositioned for comfort in reclining chair with knee extended and elevated.  BP 139/68 after transfer; HR 79 BPM; SpO2 97% on 2L supplemental O2  Pt complains of numbness/tingling in R great toe and plantar surface of R foot; PA Mardella LaymanLindsey was present and discussed this issue with pt.        Mobility  Bed Mobility Overal bed mobility: Needs Assistance Bed Mobility: Supine to Sit Supine to sit: Min assist General bed mobility comments: support for RLE off of bed Transfers Overall transfer  level: Needs assistance Equipment used: Rolling walker (2 wheeled) Transfers: Sit to/from UGI CorporationStand;Stand Pivot Transfers Sit to Stand: Min assist;+2 safety/equipment Stand pivot transfers: Min assist;+2 safety/equipment General transfer comment: Pt needs Min assist to steady RW, +2 present for safety but only 1 person for physical assist. Pt tolerates WB on L without knee buckling while wearing knee immobilizer. Pt became nauseated upon standing but no emesis.    Exercises Total Joint Exercises Ankle Circles/Pumps: AROM;10 reps;Both;Seated   PT Diagnosis: Difficulty walking;Abnormality of gait;Acute pain  PT Problem List: Decreased strength;Decreased range of motion;Decreased activity tolerance;Decreased balance;Decreased mobility;Decreased coordination;Decreased knowledge of use of DME;Decreased knowledge of precautions;Obesity;Pain PT Treatment Interventions: DME instruction;Gait training;Stair training;Functional mobility training;Therapeutic activities;Therapeutic exercise;Balance training;Neuromuscular re-education;Patient/family education;Modalities     PT Goals(Current goals can be found in the care plan section) Acute Rehab PT Goals Patient Stated Goal: Go home, be able to walk normal PT Goal Formulation: With patient Time For Goal Achievement: 04/06/13 Potential to Achieve Goals: Good  Visit Information  Last PT Received On: 03/30/13 Assistance Needed: +2 History of Present Illness: s/p Right Total Knee Revision       Prior Functioning  Home Living Family/patient expects to be discharged to:: Private residence Living Arrangements: Other relatives Available Help at Discharge: Family;Available 24 hours/day Type of Home: House Home Access: Stairs to enter Entergy CorporationEntrance Stairs-Number of Steps: 2 Entrance Stairs-Rails: None Home Layout: One level Home Equipment: Bedside commode;Shower seat - built in;Walker - 2 wheels Additional Comments: Sister in-law will be staying with  pt. 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 PA aware)   Balance Balance Overall balance assessment: Needs assistance Sitting-balance support: No upper extremity supported Sitting balance-Leahy Scale: Good Sitting balance - Comments: Sits edge of bed without support, able to maneuver to use urinal without assist Standing balance support: No upper extremity supported Standing balance-Leahy Scale: Fair Standing balance comment: Took hands off walker momentarily, no LOB General Comments General comments (skin integrity, edema, etc.): Pt became nauseated, but no emesis upon standing  End of Session PT - End of Session Equipment Utilized During Treatment: Gait belt;Right knee immobilizer Activity Tolerance: Patient limited by fatigue (Nausea) Patient left: in chair;with call bell/phone within reach;with family/visitor present Nurse Communication: Mobility status;Other (comment) (nausea)  GP    Charlsie Merles, PT [phone]   Berton Mount 03/30/2013, 4:24 PM

## 2013-03-30 NOTE — Anesthesia Procedure Notes (Signed)
Procedure Name: LMA Insertion Date/Time: 03/30/2013 8:36 AM Performed by: Sharlene DoryWALKER, Chea Malan E Pre-anesthesia Checklist: Patient identified, Emergency Drugs available, Suction available, Patient being monitored and Timeout performed Patient Re-evaluated:Patient Re-evaluated prior to inductionOxygen Delivery Method: Circle system utilized Preoxygenation: Pre-oxygenation with 100% oxygen Intubation Type: IV induction LMA: LMA inserted LMA Size: 4.0 Number of attempts: 1 Placement Confirmation: positive ETCO2 and breath sounds checked- equal and bilateral Tube secured with: Tape Dental Injury: Teeth and Oropharynx as per pre-operative assessment
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
## 2013-03-30 NOTE — Anesthesia Preprocedure Evaluation (Addendum)
Anesthesia Evaluation  Patient identified by MRN, date of birth, ID band Patient awake    Reviewed: Allergy & Precautions, H&P , NPO status , Patient's Chart, lab work & pertinent test results  History of Anesthesia Complications Negative for: history of anesthetic complications  Airway Mallampati: II TM Distance: >3 FB Neck ROM: Full    Dental  (+) Teeth Intact, Dental Advisory Given   Pulmonary neg pulmonary ROS, former smoker,  breath sounds clear to auscultation        Cardiovascular hypertension, Pt. on medications Rhythm:Regular Rate:Normal     Neuro/Psych  Headaches, negative neurological ROS     GI/Hepatic negative GI ROS, Neg liver ROS,   Endo/Other    Renal/GU negative Renal ROS     Musculoskeletal  (+) Arthritis -, Osteoarthritis,    Abdominal (+) + obese,   Peds  Hematology negative hematology ROS (+)   Anesthesia Other Findings   Reproductive/Obstetrics                          Anesthesia Physical Anesthesia Plan  ASA: II  Anesthesia Plan: General   Post-op Pain Management:    Induction: Intravenous  Airway Management Planned: Oral ETT  Additional Equipment:   Intra-op Plan:   Post-operative Plan: Extubation in OR  Informed Consent: I have reviewed the patients History and Physical, chart, labs and discussed the procedure including the risks, benefits and alternatives for the proposed anesthesia with the patient or authorized representative who has indicated his/her understanding and acceptance.     Plan Discussed with: CRNA and Surgeon  Anesthesia Plan Comments:         Anesthesia Quick Evaluation

## 2013-03-30 NOTE — Progress Notes (Signed)
Orthopedic Tech Progress Note Patient Details:  Travis SkeetersJohnie Collier [birth year] [id number]  Ortho Devices Ortho Device/Splint Location: Foot roll Ortho Device/Splint Interventions: Application   Cammer, Mickie BailJennifer Carol 03/30/2013, 1:54 PM

## 2013-03-30 NOTE — Anesthesia Postprocedure Evaluation (Signed)
Anesthesia Post Note  Patient: Travis Collier  Procedure(s) Performed: Procedure(s) (LRB): TOTAL KNEE REVISION- RIGHT (Right)  Anesthesia type: general  Patient location: PACU  Post pain: Pain level controlled  Post assessment: Patient's Cardiovascular Status Stable  Last Vitals:  Filed Vitals:   03/30/13 1145  BP: 158/75  Pulse: 72  Temp:   Resp: 10    Post vital signs: Reviewed and stable  Level of consciousness: sedated  Complications: No apparent anesthesia complications

## 2013-03-31 LAB — CBC
HCT: 41 % (ref 39.0–52.0)
Hemoglobin: 13.7 g/dL (ref 13.0–17.0)
MCH: 29.5 pg (ref 26.0–34.0)
MCHC: 33.4 g/dL (ref 30.0–36.0)
MCV: 88.2 fL (ref 78.0–100.0)
PLATELETS: 213 10*3/uL (ref 150–400)
RBC: 4.65 MIL/uL (ref 4.22–5.81)
RDW: 13.3 % (ref 11.5–15.5)
WBC: 14.9 10*3/uL — ABNORMAL HIGH (ref 4.0–10.5)

## 2013-03-31 LAB — BASIC METABOLIC PANEL
BUN: 15 mg/dL (ref 6–23)
CALCIUM: 8.9 mg/dL (ref 8.4–10.5)
CO2: 21 mEq/L (ref 19–32)
CREATININE: 1.02 mg/dL (ref 0.50–1.35)
Chloride: 103 mEq/L (ref 96–112)
GFR calc Af Amer: >90 mL/min (ref >90)
GFR calc non Af Amer: 78 mL/min — ABNORMAL LOW (ref >90)
Glucose, Bld: 220 mg/dL — ABNORMAL HIGH (ref 70–99)
Potassium: 4.8 mEq/L (ref 3.7–5.3)
Sodium: 139 mEq/L (ref 137–147)

## 2013-03-31 NOTE — Progress Notes (Signed)
Subjective: 1 Day Post-Op Procedure(s) (LRB): TOTAL KNEE REVISION- RIGHT (Right) Patient reports pain as 0 on 0-10 scale.  Travis Collier is doing very well and has not had to use any narcotic pain medication.  Positive flatus, but no bm as of yet.  Good appetite.  No nausea/vomiting.  Objective: Vital signs in last 24 hours: Temp:  [98 F (36.7 C)-99.5 F (37.5 C)] 98 F (36.7 C) (03/12 0610) Pulse Rate:  [72-88] 78 (03/12 0610) Resp:  [10-20] 18 (03/12 0610) BP: (132-191)/(68-99) 132/78 mmHg (03/12 0610) SpO2:  [95 %-99 %] 97 % (03/12 0610)  Intake/Output from previous day: 03/11 0701 - 03/12 0700 In: 2060 [P.O.:410; I.V.:1600; IV Piggyback:50] Out: 2325 [Urine:1000; Drains:1275; Blood:50] Intake/Output this shift: Total I/O In: 360 [P.O.:360] Out: 1225 [Urine:1000; Drains:225]  No results found for this basename: HGB,  in the last 72 hours No results found for this basename: WBC, RBC, HCT, PLT,  in the last 72 hours No results found for this basename: NA, K, CL, CO2, BUN, CREATININE, GLUCOSE, CALCIUM,  in the last 72 hours No results found for this basename: LABPT, INR,  in the last 72 hours  Neurologically intact ABD soft Neurovascular intact Sensation intact distally Intact pulses distally Dorsiflexion/Plantar flexion intact Compartment soft No drainage through ace bandage hemovac drain pulled by me today  Assessment/Plan: 1 Day Post-Op Procedure(s) (LRB): TOTAL KNEE REVISION- RIGHT (Right) Advance diet Up with therapy Plan for discharge tomorrow Discharge home with home health Will d/c  IV fluids tomorrow due to slightly decreased kidney fxn Patient has concerns about home health as he and his wife have separated and he now has a new residence.  Please make sure home health has new address.  Gearldine ShownNTON, M. LINDSEY 03/31/2013, 6:19 AM

## 2013-03-31 NOTE — Anesthesia Postprocedure Evaluation (Signed)
  Anesthesia Post-op Note  Patient: Travis Collier  Procedure(s) Performed: Procedure(s): TOTAL KNEE REVISION- RIGHT (Right)  Patient Location: PACU  Anesthesia Type:General  Level of Consciousness: awake and alert   Airway and Oxygen Therapy: Patient Spontanous Breathing  Post-op Pain: mild  Post-op Assessment: Post-op Vital signs reviewed, Patient's Cardiovascular Status Stable, Respiratory Function Stable, Patent Airway, No signs of Nausea or vomiting and Pain level controlled  Post-op Vital Signs: Reviewed and stable  Complications: No apparent anesthesia complications

## 2013-03-31 NOTE — Op Note (Signed)
[name]:  Pall, Leviathan             ACCOUNT NO.:  [id number]  MEDICAL RECORD NO.:  [id number]  LOCATION:  [id number]                        FACILITY:  [facility]  PHYSICIAN:  Loreta Aveaniel F. Markavious Micco, M.D. 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  PROCEDURE:  Revision of right total knee replacement tibial component. Revise from a 9 mm to a 13 mm polyethylene component.  SURGEON-Ronika Kelson:  Loreta Aveaniel F. Bassheva Flury, M.D.  ASSISTANT:  Rayfield CitizenLindsay Anton, PA, present throughout the entire case and necessary for timely completion of procedure.  ANESTHESIA:  General.  BLOOD LOSS:  Minimal.  SPECIMENS:  None.  CULTURES:  None.  COMPLICATIONS:  None.  DRESSINGS:  Soft compressive knee immobilizer.  TOURNIQUET TIME:  1 hour 15 minutes.  PROCEDURE:  The patient was brought to the operating room, placed on the operating table in supine position.  After adequate anesthesia had been obtained, knee examined.  Full extension, full flexion.  He definitely has developed increasing laxity of varus and valgus are both in extension and flexion.  Anterior approach using his previous incision after tourniquet inflated.  Skin and subcutaneous tissue divided. Medial arthrotomy, vastus splitting, preserving quad tendon.  Clear fluid.  No evidence of infection at all.  Fair amount of adhesions taken down to allow for exposure.  All components thoroughly assessed.  These were all well fixed.  There was no evidence of infection or loosening of all the components.  No  significant excessive wear.  Remaining components are protected.  Polyethylene taken off of the tibial tray. Fair amount of wear to clear this out.  Cleared out adhesions for exposure.  After appropriate trials, I chose the 13-mm component going up from 9 mm.  Then, it was firmly inserted down into the tibial tray and reduced.  The first component I tried to insert I could not insert in the back of the lip that we had to abandon that.  The 2nd component I was very pleased with how this fit in place.  Once that was in place, the knee was examined.  Still maintained full motion.  Excellent stability.  Wound was thoroughly irrigated.  Soft tissues injected with Exparel.  Hemovac was placed.  Arthrotomy was closed with #1 Vicryl. Skin and subcutaneous tissue with subcutaneous subcuticular closure. Sterile compressive dressing applied.  Tourniquet deflated and removed. Knee immobilizer applied.  Anesthesia reversed.  Brought to the recovery room.  Tolerated the surgery well.  No complications.     Loreta Aveaniel F. Brek Reece, M.D.     DFM/MEDQ  D:  03/30/2013  T:  03/31/2013  Job:  [id number]

## 2013-03-31 NOTE — Progress Notes (Signed)
OT Cancellation Note and Discharge  Patient Details Name: Travis SkeetersJohnie Collier MRN: [id number] DOB: 01-05-1951   Cancelled Treatment:    Reason Eval/Treat Not Completed: Other (comment). Pt had his original knee surgery in April of last year and feels he does not have any questions about BADLs this time. No OT needs identified, we will sign off.  Evette GeorgesLeonard, Patrecia Veiga Eva [phone] 03/31/2013, 9:57 AM

## 2013-03-31 NOTE — Progress Notes (Signed)
Physical Therapy Treatment & Discharge Patient Details Name: Travis Collier MRN: [id number] DOB: Dec 24, 1950 Today's Date: 03/31/2013 Time: 1517-6160 PT Time Calculation (min): 18 min  PT Assessment / Plan / Recommendation  History of Present Illness s/p Right Total Knee Revision   PT Comments   Pt has been ambulating in room without assistance; states he is ready to go home. Pt performed stair training and although he states he will navigate the stairs his "own way," he this technique adequately for d/c. All education covered and goals met/adequate for discharge to home with HHPT. PT signing off at this time.  Follow Up Recommendations  Home health PT;Supervision for mobility/OOB     Does the patient have the potential to tolerate intense rehabilitation     Barriers to Discharge        Equipment Recommendations  None recommended by PT    Recommendations for Other Services    Frequency 7X/week   Progress towards PT Goals Progress towards PT goals: Goals met/education completed, patient discharged from PT  Plan Other (comment) (Pt to be D/c)    Precautions / Restrictions Precautions Precautions: Knee Precaution Booklet Issued: Yes (comment) Required Braces or Orthoses: Knee Immobilizer - Right Restrictions Weight Bearing Restrictions: Yes RLE Weight Bearing: Weight bearing as tolerated   Pertinent Vitals/Pain Patient states he is in no pain at all Pt repositioned for comfort in reclining chair with knee elevated and footsie roll in place.    Mobility  Transfers Overall transfer level: Modified independent Equipment used: Rolling walker (2 wheeled) Transfers: Sit to/from Stand Sit to Stand: Modified independent (Device/Increase time) General transfer comment: Pt is impulsive and anxious to leave, he stands up quickly but safely. Pt was in restroom when PT entered and had just fully dressed himself while standing Ambulation/Gait Ambulation/Gait assistance: Modified  independent (Device/Increase time) Ambulation Distance (Feet): 200 Feet Assistive device: Rolling walker (2 wheeled) Gait Pattern/deviations: Step-through pattern General Gait Details: Pt has been walking in room by himself; ambulated with PT to gym for stair training and back to room, not needing any restbreaks Stairs: Yes Stairs assistance: Supervision Stair Management: No rails;Step to pattern;Backwards;Forwards;With walker Number of Stairs: 2 (x2) General stair comments: Pt trained on safe technique for navigating stairs but states he prefers to do it "his own way" because he has been doing it this way for "the past year." Verbal cues for safer technique to navigate stairs backwards however pt states he will be going up forwards with a walker when he gets home.    Exercises Total Joint Exercises Ankle Circles/Pumps: AROM;10 reps;Both;Seated Quad Sets: AROM;Both;10 reps;Seated (legs elevated on reclining chair) Heel Slides: AAROM;Right;5 reps;Seated Goniometric ROM: 3-86 degrees knee flexion PROM   PT Diagnosis:    PT Problem List:   PT Treatment Interventions:     PT Goals (current goals can now be found in the care plan section) Acute Rehab PT Goals Patient Stated Goal: Go home, be able to walk normal PT Goal Formulation: With patient Time For Goal Achievement: 04/06/13 Potential to Achieve Goals: Good  Visit Information  Last PT Received On: 03/31/13 Assistance Needed: +1 History of Present Illness: s/p Right Total Knee Revision    Subjective Data  Subjective: Ready to go home Patient Stated Goal: Go home, be able to walk normal   Cognition  Cognition Arousal/Alertness: Awake/alert Behavior During Therapy: WFL for tasks assessed/performed Overall Cognitive Status: Within Functional Limits for tasks assessed    Balance  Balance Sitting balance-Leahy Scale: Good Sitting balance -  Comments: Sits edge of bed without support, able to maneuver to use urinal without  assist Standing balance-Leahy Scale: Fair  End of Session PT - End of Session Equipment Utilized During Treatment: Right knee immobilizer Activity Tolerance: Patient tolerated treatment well (Nausea) Patient left: in chair;with call bell/phone within reach;with family/visitor present   [place], [place]  Ellouise Newer 03/31/2013, 1:17 PM

## 2013-04-04 NOTE — Care Management Note (Signed)
CARE MANAGEMENT NOTE 04/04/2013  Patient:  Travis Collier,Travis Collier   Account Number:  [id number]  Date Initiated:  03/31/2013  Documentation initiated by:  Vance PeperBRADY,Jalia Zuniga  Subjective/Objective Assessment:   63 yr old male s/p right total knee revision     Action/Plan:   patient preoperatively setup with [facility], No changes. CM called [facility] to confirm that patient has been seen. Patient has DME from previous surgery.   Anticipated DC Date:  03/31/2013   Anticipated DC Plan:  HOME W HOME HEALTH SERVICES      DC Planning Services  CM consult      [facility]   [facility] offered to / List presented [facility]:          [facility]      Status of service:  Completed, signed off Medicare Important Message given?   (If response is "NO", the following Medicare IM given date fields will be blank) Date Medicare IM given:   Date Additional Medicare IM given:    Discharge Disposition:  HOME W HOME HEALTH SERVICES  Per UR Regulation:    If discussed at Long Length of Stay Meetings, dates discussed:    Comments:

## 2013-04-05 ENCOUNTER — Encounter (HOSPITAL_COMMUNITY): Payer: Self-pay | Admitting: Orthopedic Surgery

## 2013-04-05 NOTE — Addendum Note (Signed)
Addendum created 04/05/13 1320 by Bedelia PersonLee Cesare Sumlin, MD   Modules edited: Anesthesia Attestations

## 2013-09-30 IMAGING — US US EXTREM LOW VENOUS*R*
1 series · 14 of 24 positions shown · non-contrast
Comparison: None

CLINICAL DATA: Right lower extremity pain and swelling, post knee
replacement 04/28/2012

RIGHT LOWER EXTREMITY VENOUS DUPLEX ULTRASOUND
TECHNIQUE: Gray-scale sonography with graded compression, as well
as color Doppler and duplex ultrasound, were performed to evaluate
the deep venous system of the lower extremity from the level of the
common femoral vein through the popliteal and proximal calf veins.
Spectral Doppler was utilized to evaluate flow at rest and with
distal augmentation maneuvers.

[Series 1: us extrem low venous*right* · 14 of 30 slices shown]
[im 1/30]
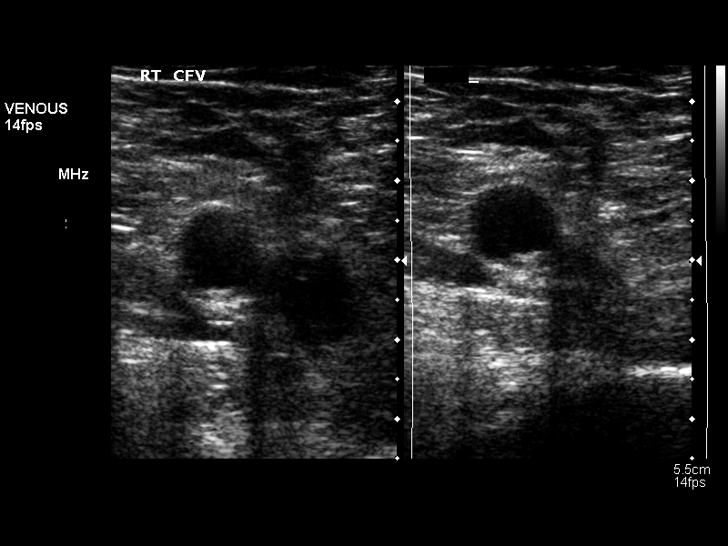
[im 3/30]
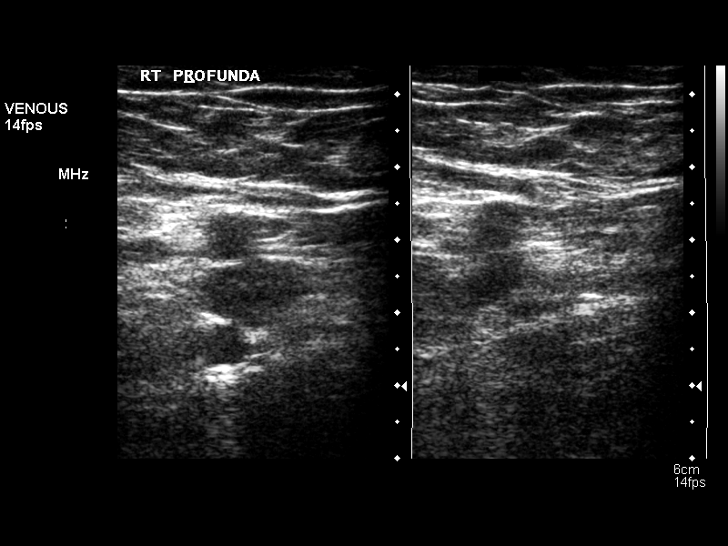
[im 6/30]
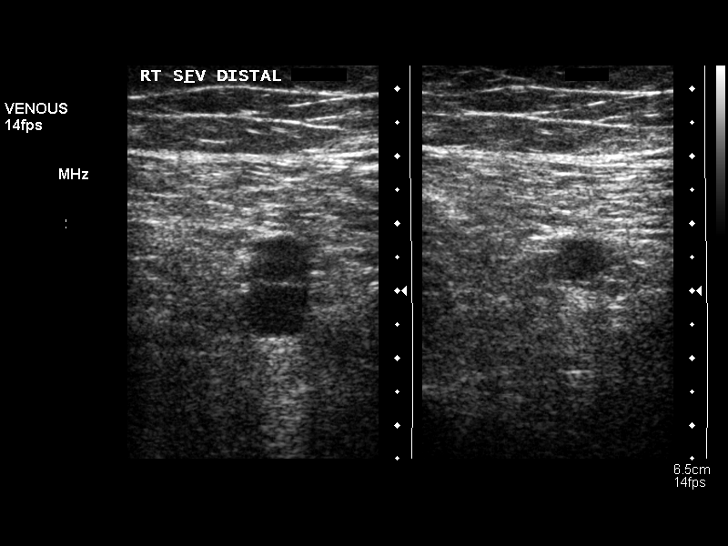
[im 8/30]
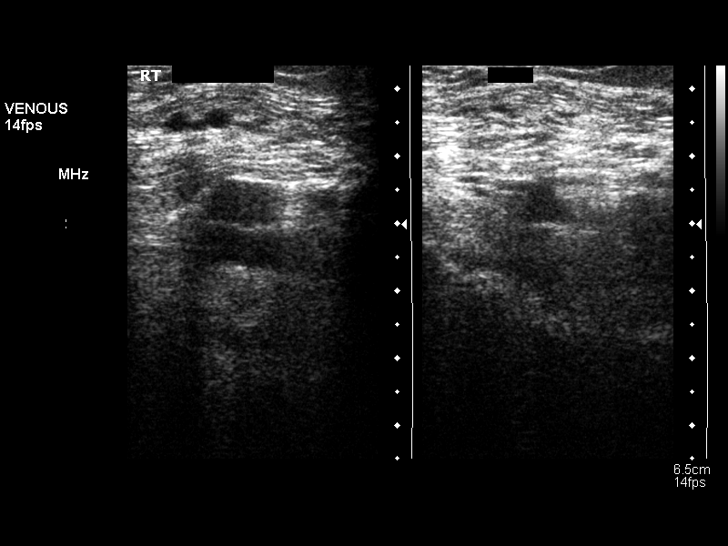
[im 9/30]
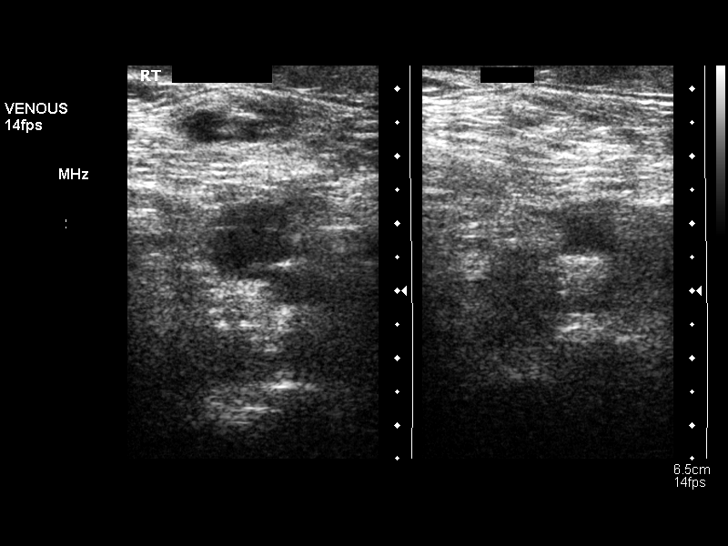
[im 12/30]
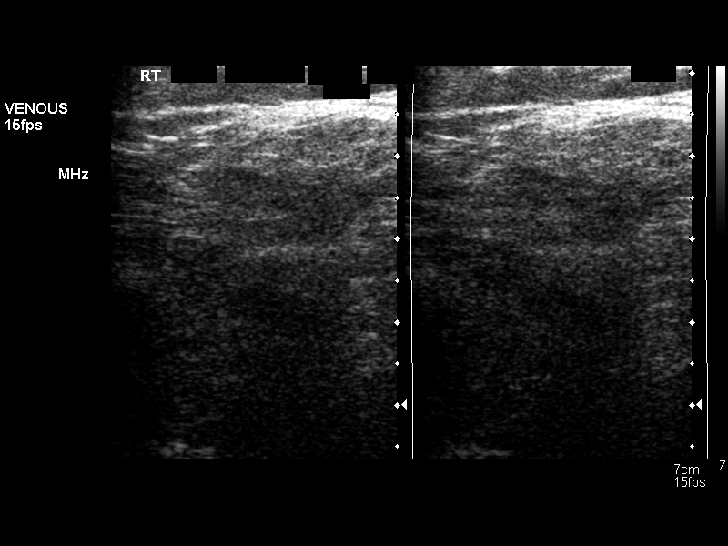
[im 14/30]
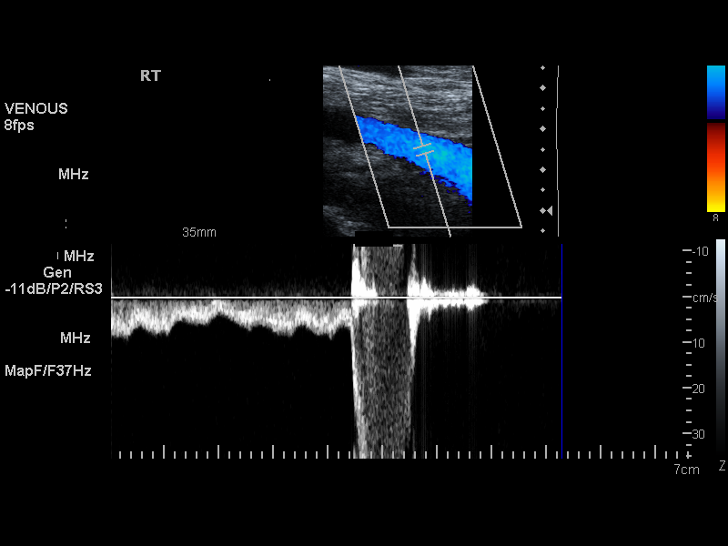
[im 16/30]
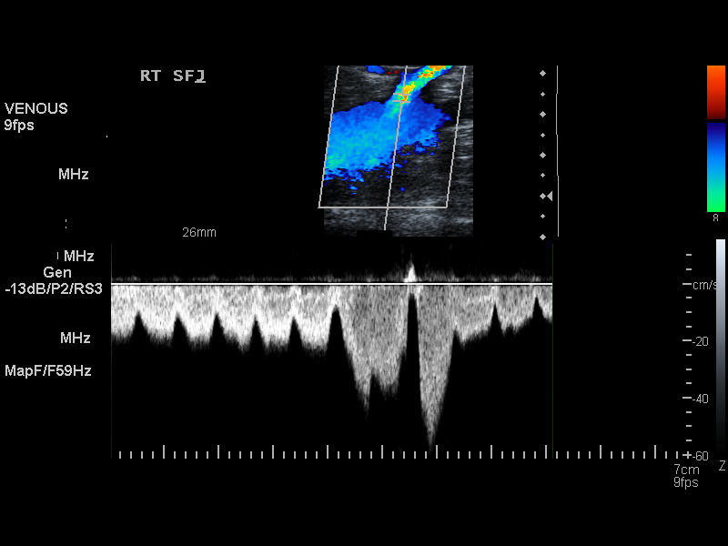
[im 18/30]
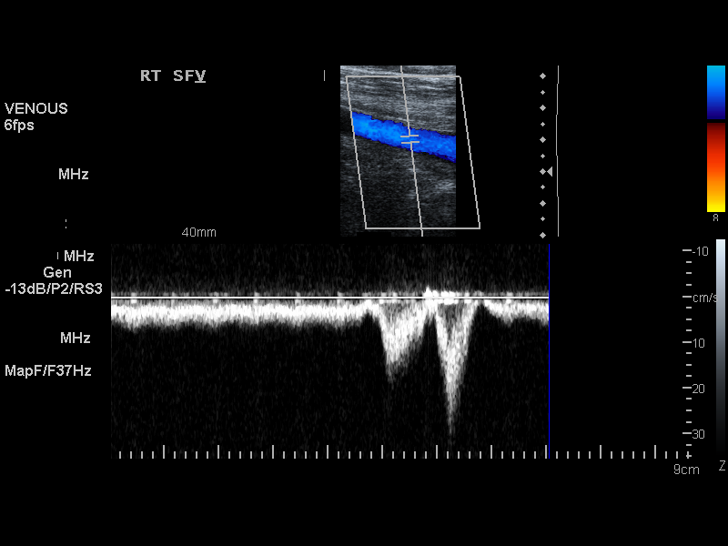
[im 21/30]
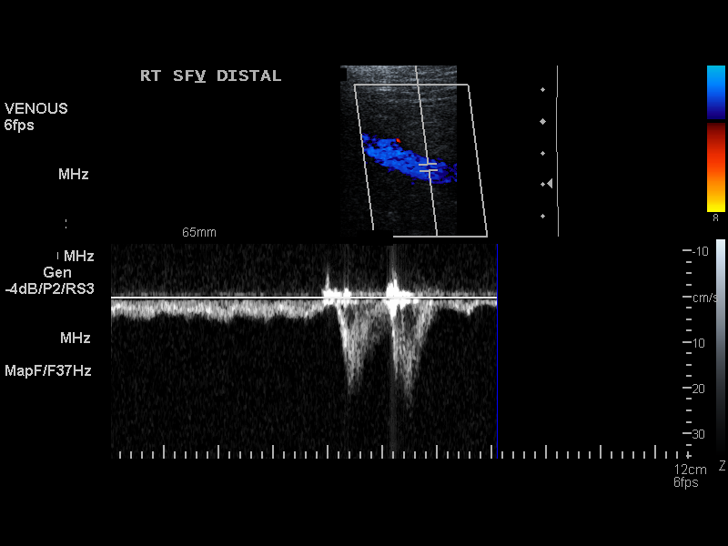
[im 23/30]
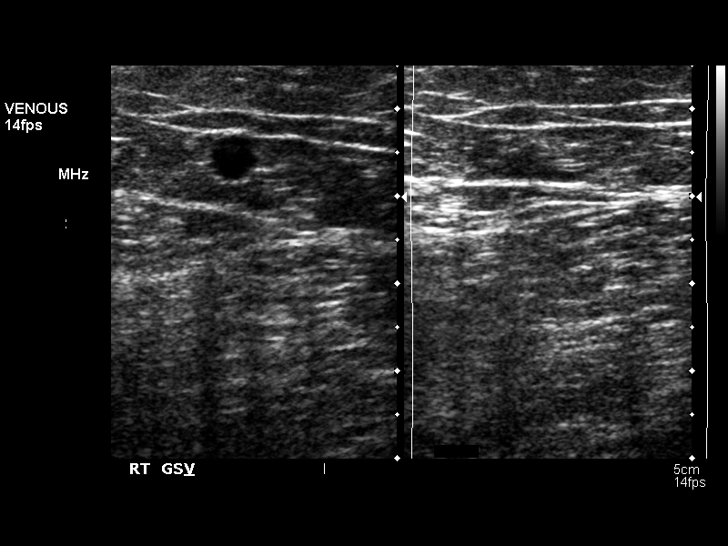
[im 24/30]
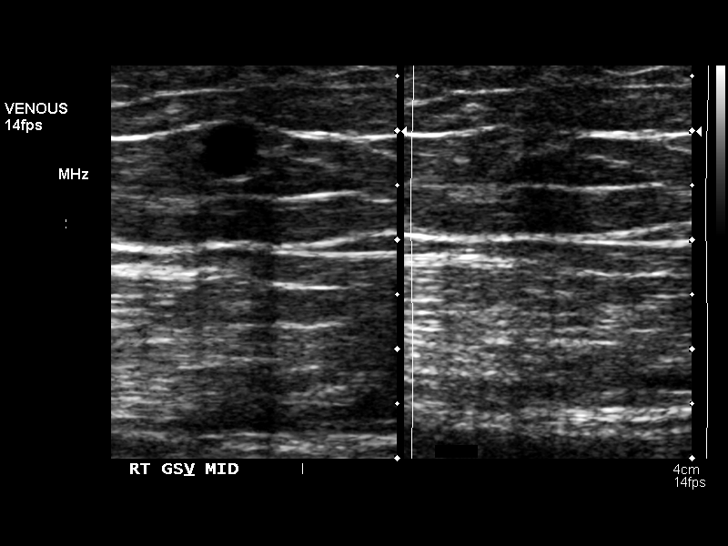
[im 27/30]
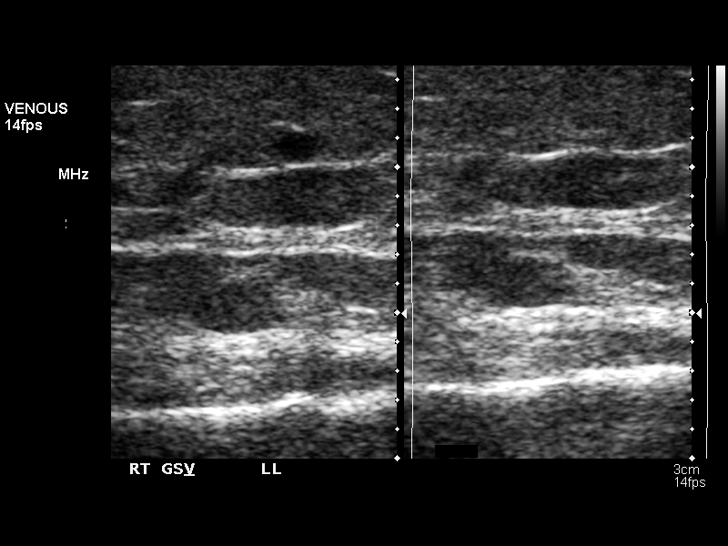
[im 30/30]
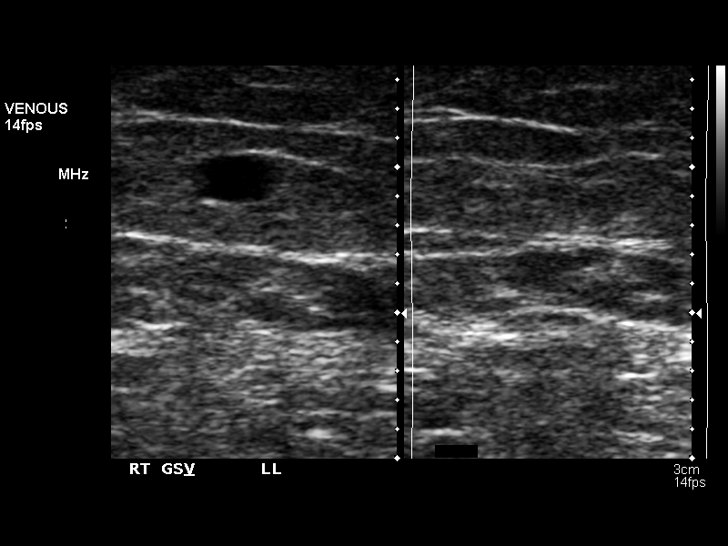

[14 of 24 positions shown; findings below may reference images not displayed]

FINDINGS: Deep venous system patent and compressible from right groin through
popliteal fossa.
Spontaneous venous flow present with intact augmentation.
No intraluminal thrombus identified.
Visualized portion of the greater saphenous system unremarkable.
Calf veins are inadequately visualized due to body habitus.
IMPRESSION: No evidence of deep venous thrombosis in the right lower extremity
as above.
